# Patient Record
Sex: Female | Born: 1955 | Hispanic: Refuse to answer | Marital: Married
Health system: Midwestern US, Community
[De-identification: ages and names within clinical notes are randomized; demographics above are authoritative.]

## PROBLEM LIST (undated history)

## (undated) DIAGNOSIS — M461 Sacroiliitis, not elsewhere classified: Secondary | ICD-10-CM

## (undated) DIAGNOSIS — M457 Ankylosing spondylitis of lumbosacral region: Secondary | ICD-10-CM

## (undated) DIAGNOSIS — M5013 Cervical disc disorder with radiculopathy, cervicothoracic region: Secondary | ICD-10-CM

## (undated) DIAGNOSIS — M542 Cervicalgia: Secondary | ICD-10-CM

## (undated) DIAGNOSIS — R1084 Generalized abdominal pain: Secondary | ICD-10-CM

## (undated) DIAGNOSIS — K5790 Diverticulosis of intestine, part unspecified, without perforation or abscess without bleeding: Secondary | ICD-10-CM

## (undated) DIAGNOSIS — F329 Major depressive disorder, single episode, unspecified: Secondary | ICD-10-CM

## (undated) DIAGNOSIS — K567 Ileus, unspecified: Secondary | ICD-10-CM

## (undated) DIAGNOSIS — N760 Acute vaginitis: Secondary | ICD-10-CM

## (undated) DIAGNOSIS — M5090 Cervical disc disorder, unspecified, unspecified cervical region: Secondary | ICD-10-CM

## (undated) DIAGNOSIS — G4700 Insomnia, unspecified: Secondary | ICD-10-CM

## (undated) DIAGNOSIS — M797 Fibromyalgia: Secondary | ICD-10-CM

## (undated) DIAGNOSIS — F419 Anxiety disorder, unspecified: Secondary | ICD-10-CM

## (undated) DIAGNOSIS — I771 Stricture of artery: Secondary | ICD-10-CM

## (undated) DIAGNOSIS — F32A Depression, unspecified: Secondary | ICD-10-CM

## (undated) DIAGNOSIS — K915 Postcholecystectomy syndrome: Secondary | ICD-10-CM

## (undated) DIAGNOSIS — K589 Irritable bowel syndrome without diarrhea: Secondary | ICD-10-CM

## (undated) HISTORY — PX: MANDIBLE SURGERY: SHX707

## (undated) HISTORY — DX: Anxiety disorder, unspecified: F41.9

## (undated) HISTORY — DX: Major depressive disorder, single episode, unspecified: F32.9

## (undated) HISTORY — DX: Diverticulosis of intestine, part unspecified, without perforation or abscess without bleeding: K57.90

## (undated) HISTORY — DX: Stricture of artery: I77.1

## (undated) HISTORY — DX: Fibromyalgia: M79.7

## (undated) HISTORY — DX: Insomnia, unspecified: G47.00

## (undated) HISTORY — DX: Depression, unspecified: F32.A

## (undated) HISTORY — DX: Acute vaginitis: N76.0

## (undated) HISTORY — DX: Cervical disc disorder, unspecified, unspecified cervical region: M50.90

## (undated) HISTORY — PX: CHOLECYSTECTOMY: SHX55

## (undated) HISTORY — DX: Ileus, unspecified: K56.7

## (undated) HISTORY — DX: Irritable bowel syndrome, unspecified: K58.9

## (undated) HISTORY — DX: Postcholecystectomy syndrome: K91.5

---

## 1992-05-17 ENCOUNTER — Encounter: Payer: Self-pay | Admitting: Internal Medicine

## 1995-01-29 ENCOUNTER — Encounter: Payer: Self-pay | Admitting: Gastroenterology

## 1998-08-22 ENCOUNTER — Other Ambulatory Visit: Admission: RE | Admit: 1998-08-22 | Discharge: 1998-08-22 | Payer: Self-pay | Admitting: *Deleted

## 1998-08-25 ENCOUNTER — Encounter: Payer: Self-pay | Admitting: *Deleted

## 1998-08-25 ENCOUNTER — Ambulatory Visit (HOSPITAL_COMMUNITY): Admission: RE | Admit: 1998-08-25 | Discharge: 1998-08-25 | Payer: Self-pay | Admitting: *Deleted

## 1998-11-28 ENCOUNTER — Encounter: Payer: Self-pay | Admitting: *Deleted

## 1998-11-28 ENCOUNTER — Ambulatory Visit (HOSPITAL_COMMUNITY): Admission: RE | Admit: 1998-11-28 | Discharge: 1998-11-28 | Payer: Self-pay | Admitting: *Deleted

## 1999-08-23 ENCOUNTER — Encounter: Payer: Self-pay | Admitting: Orthopedic Surgery

## 1999-08-23 ENCOUNTER — Encounter: Admission: RE | Admit: 1999-08-23 | Discharge: 1999-08-23 | Payer: Self-pay | Admitting: Orthopedic Surgery

## 1999-11-24 ENCOUNTER — Other Ambulatory Visit: Admission: RE | Admit: 1999-11-24 | Discharge: 1999-11-24 | Payer: Self-pay | Admitting: *Deleted

## 2002-12-18 ENCOUNTER — Encounter: Payer: Self-pay | Admitting: Orthopedic Surgery

## 2002-12-18 ENCOUNTER — Encounter: Admission: RE | Admit: 2002-12-18 | Discharge: 2002-12-18 | Payer: Self-pay | Admitting: Orthopedic Surgery

## 2009-04-22 ENCOUNTER — Encounter (INDEPENDENT_AMBULATORY_CARE_PROVIDER_SITE_OTHER): Payer: Self-pay | Admitting: *Deleted

## 2009-04-22 ENCOUNTER — Encounter: Admission: RE | Admit: 2009-04-22 | Discharge: 2009-04-22 | Payer: Self-pay | Admitting: Family Medicine

## 2009-04-29 ENCOUNTER — Encounter: Payer: Self-pay | Admitting: Internal Medicine

## 2009-05-02 ENCOUNTER — Ambulatory Visit: Payer: Self-pay | Admitting: Internal Medicine

## 2009-05-04 ENCOUNTER — Telehealth: Payer: Self-pay | Admitting: Internal Medicine

## 2009-05-05 ENCOUNTER — Ambulatory Visit: Payer: Self-pay | Admitting: Internal Medicine

## 2009-05-06 ENCOUNTER — Telehealth: Payer: Self-pay | Admitting: Internal Medicine

## 2009-05-09 ENCOUNTER — Telehealth: Payer: Self-pay | Admitting: Internal Medicine

## 2009-05-12 ENCOUNTER — Ambulatory Visit: Payer: Self-pay | Admitting: Internal Medicine

## 2009-05-12 ENCOUNTER — Telehealth: Payer: Self-pay | Admitting: Internal Medicine

## 2009-05-12 DIAGNOSIS — IMO0001 Reserved for inherently not codable concepts without codable children: Secondary | ICD-10-CM | POA: Insufficient documentation

## 2009-05-12 DIAGNOSIS — R1011 Right upper quadrant pain: Secondary | ICD-10-CM | POA: Insufficient documentation

## 2009-05-12 DIAGNOSIS — F411 Generalized anxiety disorder: Secondary | ICD-10-CM | POA: Insufficient documentation

## 2009-05-12 DIAGNOSIS — Z8719 Personal history of other diseases of the digestive system: Secondary | ICD-10-CM | POA: Insufficient documentation

## 2009-05-12 DIAGNOSIS — K5730 Diverticulosis of large intestine without perforation or abscess without bleeding: Secondary | ICD-10-CM | POA: Insufficient documentation

## 2009-05-12 DIAGNOSIS — I10 Essential (primary) hypertension: Secondary | ICD-10-CM | POA: Insufficient documentation

## 2009-05-12 DIAGNOSIS — F329 Major depressive disorder, single episode, unspecified: Secondary | ICD-10-CM

## 2009-05-12 DIAGNOSIS — K648 Other hemorrhoids: Secondary | ICD-10-CM | POA: Insufficient documentation

## 2009-05-12 DIAGNOSIS — G4700 Insomnia, unspecified: Secondary | ICD-10-CM | POA: Insufficient documentation

## 2009-05-12 DIAGNOSIS — K589 Irritable bowel syndrome without diarrhea: Secondary | ICD-10-CM | POA: Insufficient documentation

## 2009-05-12 DIAGNOSIS — F331 Major depressive disorder, recurrent, moderate: Secondary | ICD-10-CM | POA: Insufficient documentation

## 2009-05-20 ENCOUNTER — Ambulatory Visit: Payer: Self-pay | Admitting: Internal Medicine

## 2009-05-20 ENCOUNTER — Encounter: Payer: Self-pay | Admitting: Internal Medicine

## 2009-05-20 LAB — CONVERTED CEMR LAB: UREASE: NEGATIVE

## 2009-05-24 ENCOUNTER — Encounter: Payer: Self-pay | Admitting: Internal Medicine

## 2009-05-31 ENCOUNTER — Telehealth: Payer: Self-pay | Admitting: Internal Medicine

## 2009-08-10 ENCOUNTER — Encounter: Payer: Self-pay | Admitting: Internal Medicine

## 2011-10-11 DIAGNOSIS — F411 Generalized anxiety disorder: Secondary | ICD-10-CM | POA: Insufficient documentation

## 2011-10-11 DIAGNOSIS — K219 Gastro-esophageal reflux disease without esophagitis: Secondary | ICD-10-CM | POA: Insufficient documentation

## 2014-07-22 ENCOUNTER — Other Ambulatory Visit: Payer: Self-pay | Admitting: Orthopedic Surgery

## 2014-07-22 DIAGNOSIS — M549 Dorsalgia, unspecified: Secondary | ICD-10-CM

## 2014-08-01 ENCOUNTER — Other Ambulatory Visit: Payer: Self-pay

## 2014-08-02 ENCOUNTER — Ambulatory Visit
Admission: RE | Admit: 2014-08-02 | Discharge: 2014-08-02 | Disposition: A | Discharge status: Home / Self Care | Payer: BC Managed Care – PPO | Source: Ambulatory Visit | Attending: Orthopedic Surgery | Admitting: Orthopedic Surgery

## 2014-08-02 DIAGNOSIS — M549 Dorsalgia, unspecified: Secondary | ICD-10-CM

## 2015-02-15 ENCOUNTER — Encounter: Payer: Self-pay | Admitting: Physician Assistant

## 2015-02-16 ENCOUNTER — Telehealth: Payer: Self-pay | Admitting: Internal Medicine

## 2015-02-16 NOTE — Telephone Encounter (Signed)
[facility] forward 45 pages to Dr.Brodie

## 2015-02-18 ENCOUNTER — Other Ambulatory Visit: Payer: Self-pay | Admitting: *Deleted

## 2015-02-21 ENCOUNTER — Encounter: Payer: Self-pay | Admitting: Internal Medicine

## 2015-02-21 ENCOUNTER — Ambulatory Visit (INDEPENDENT_AMBULATORY_CARE_PROVIDER_SITE_OTHER): Payer: BLUE CROSS/BLUE SHIELD | Admitting: Physician Assistant

## 2015-02-21 ENCOUNTER — Encounter: Payer: Self-pay | Admitting: Physician Assistant

## 2015-02-21 VITALS — BP 122/76 | HR 73 | Ht 63.25 in | Wt 168.0 lb

## 2015-02-21 DIAGNOSIS — K589 Irritable bowel syndrome without diarrhea: Secondary | ICD-10-CM

## 2015-02-21 DIAGNOSIS — R1013 Epigastric pain: Secondary | ICD-10-CM | POA: Diagnosis not present

## 2015-02-21 DIAGNOSIS — R109 Unspecified abdominal pain: Secondary | ICD-10-CM | POA: Diagnosis not present

## 2015-02-21 MED ORDER — DIAZEPAM 2 MG PO TABS: 2.0000 mg | ORAL_TABLET | ORAL | Status: DC | PRN

## 2015-02-21 NOTE — Patient Instructions (Signed)
You have been scheduled for an endoscopy and colonoscopy. Please follow the written instructions given to you at your visit today. Please pick up your prep supplies at the pharmacy within the next 1-3 days. If you use inhalers (even only as needed), please bring them with you on the day of your procedure. Your physician has requested that you go to www.startemmi.com and enter the access code given to you at your visit today. This web site gives a general overview about your procedure. However, you should still follow specific instructions given to you by our office regarding your preparation for the procedure. Use your ranitidine 150 mg twice daily.   Gastroesophageal Reflux Disease, Adult Gastroesophageal reflux disease (GERD) happens when acid from your stomach flows up into the esophagus. When acid comes in contact with the esophagus, the acid causes soreness (inflammation) in the esophagus. Over time, GERD may create small holes (ulcers) in the lining of the esophagus. CAUSES   Increased body weight. This puts pressure on the stomach, making acid rise from the stomach into the esophagus.  Smoking. This increases acid production in the stomach.  Drinking alcohol. This causes decreased pressure in the lower esophageal sphincter (valve or ring of muscle between the esophagus and stomach), allowing acid from the stomach into the esophagus.  Late evening meals and a full stomach. This increases pressure and acid production in the stomach.  A malformed lower esophageal sphincter. Sometimes, no cause is found. SYMPTOMS   Burning pain in the lower part of the mid-chest behind the breastbone and in the mid-stomach area. This may occur twice a week or more often.  Trouble swallowing.  Sore throat.  Dry cough.  Asthma-like symptoms including chest tightness, shortness of breath, or wheezing. DIAGNOSIS  Your caregiver may be able to diagnose GERD based on your symptoms. In some cases, X-rays  and other tests may be done to check for complications or to check the condition of your stomach and esophagus. TREATMENT  Your caregiver may recommend over-the-counter or prescription medicines to help decrease acid production. Ask your caregiver before starting or adding any new medicines.  HOME CARE INSTRUCTIONS   Change the factors that you can control. Ask your caregiver for guidance concerning weight loss, quitting smoking, and alcohol consumption.  Avoid foods and drinks that make your symptoms worse, such as:  Caffeine or alcoholic drinks.  Chocolate.  Peppermint or mint flavorings.  Garlic and onions.  Spicy foods.  Citrus fruits, such as oranges, lemons, or limes.  Tomato-based foods such as sauce, chili, salsa, and pizza.  Fried and fatty foods.  Avoid lying down for the 3 hours prior to your bedtime or prior to taking a nap.  Eat small, frequent meals instead of large meals.  Wear loose-fitting clothing. Do not wear anything tight around your waist that causes pressure on your stomach.  Raise the head of your bed 6 to 8 inches with wood blocks to help you sleep. Extra pillows will not help.  Only take over-the-counter or prescription medicines for pain, discomfort, or fever as directed by your caregiver.  Do not take aspirin, ibuprofen, or other nonsteroidal anti-inflammatory drugs (NSAIDs). SEEK IMMEDIATE MEDICAL CARE IF:   You have pain in your arms, neck, jaw, teeth, or back.  Your pain increases or changes in intensity or duration.  You develop nausea, vomiting, or sweating (diaphoresis).  You develop shortness of breath, or you faint.  Your vomit is green, yellow, black, or looks like coffee grounds or blood.  Your stool is red, bloody, or black. These symptoms could be signs of other problems, such as heart disease, gastric bleeding, or esophageal bleeding. MAKE SURE YOU:   Understand these instructions.  Will watch your condition.  Will get  help right away if you are not doing well or get worse. Document Released: 06/13/2005 Document Revised: 11/26/2011 Document Reviewed: 03/23/2011 [facility] Patient Information 2015 [place] [place]. This information is not intended to replace advice given to you by your health care provider. Make sure you discuss any questions you have with your health care provider.

## 2015-02-21 NOTE — Progress Notes (Signed)
Reviewed. What was she drinking for 4 days? That caused her to get sick? I will proceed with EGD/colon.

## 2015-02-21 NOTE — Progress Notes (Addendum)
**Note Valerie-Identified via Obfuscation** Patient ID: Valerie Conway, female   DOB: 09/24/1955, 59 y.o.   MRN: [id number]    HPI:  Valerie HollingsheadDeborah H Stigler is a 59 y.o.   female who is known to Dr. Juanda ChanceBrodie with a history of diverticulosis, IBS, and GERD. She was last seen here in 2010. She had an EGD on 05/20/2009 due to right upper quadrant abdominal and epigastric pain. The endoscopy was a normal EGD. She had a colonoscopy on 05/05/2009 due to hematochezia. She was noted to have moderate diverticulosis in the sigmoid colon.  She managed her symptoms with Nexium and Bentyl and several months ago began to come off those medications. Approximately 3-3-1/2 weeks ago she was vacationing in [place] when she developed severe abdominal pain. She had been drinking mojitos daily for about 4 days when she developed epigastric and right-sided abdominal pain. She went to the emergency room where she was visiting in [place] and had a CT scan that showed an ileus versus small bowel obstruction. She vomited a large amount of fluid and she did not require an NG tube. Her symptoms resolved in 2 days with IV fluids and bowel rest. She says she was not given specific instructions on what to eat upon discharge and so she quickly advanced her diet and had recurrent symptoms of vomiting and abdominal distention. She put herself nothing by mouth for 24 hours then stayed on clear liquids for a couple of days and felt better. She continues to have some epigastric discomfort that is not alleviated or exacerbated with ingestion of food. She complains of feeling full after 2 bites of food and occasionally feels as if her food sticks in the lower esophagus. She has not been using her Nexium for quite some time as she says her primary care provider instructed her to use ranitidine in its place. She denies complaints of heartburn. Her bowel movements have been irregular with days of formed stools alternating with days of loose stools, however she has not had any bright red blood per rectum  or melena. She does feel her lower abdomen has been more sore over the past several months than it has been in the past. She has spent the past 3 weeks investigating etiologies for ileus and small bowel obstructions and has many questions.   Past Medical History  Diagnosis Date  . Fibromyalgia   . Insomnia   . Cervical disc disease   . Anxiety and depression   . Recurrent vaginitis   . IBS (irritable bowel syndrome)   . Post-cholecystectomy syndrome   . Ileus     Past Surgical History  Procedure Laterality Date  . Cholecystectomy     Family History  Problem Relation Age of Onset  . Alzheimer's disease Mother   . Pancreatitis Mother   . Gallbladder disease Mother   . Hypothyroidism Mother   . Colon cancer Maternal Uncle   . Prostate cancer Father   . Throat cancer Father   . Lung cancer Father   . Colon cancer Paternal Uncle    History  Substance Use Topics  . Smoking status: Never Smoker   . Smokeless tobacco: Never Used  . Alcohol Use: 0.0 oz/week    0 Standard drinks or equivalent per week     Comment: Social   Current Outpatient Prescriptions  Medication Sig Dispense Refill  . acetaminophen (TYLENOL) 325 MG tablet Take 650 mg by mouth every 6 (six) hours as needed.    . dicyclomine (BENTYL) 10 MG capsule Take 10  mg by mouth every other day.    . docusate sodium (COLACE) 100 MG capsule Take 100 mg by mouth every other day.    Marland Kitchen LORazepam (ATIVAN) 1 MG tablet   1  . Probiotic Product (ALIGN) 4 MG CAPS Take 4 mg by mouth daily.    Marland Kitchen bismuth subsalicylate (PEPTO BISMOL) 262 MG chewable tablet Chew 524 mg by mouth 2 (two) times daily as needed.    . cholecalciferol (VITAMIN D) 1000 UNITS tablet Take 3,000 Units by mouth 2 (two) times daily.     . Multiple Vitamins-Minerals (AIRBORNE) CHEW Chew 1 tablet by mouth.    . ranitidine (ZANTAC) 300 MG capsule   1  . zolpidem (AMBIEN) 10 MG tablet Take 10 mg by mouth.     No current facility-administered medications for this  visit.   Allergies  Allergen Reactions  . Codeine     Other reaction(s): Other Esophageal spasm  . Metronidazole Nausea And Vomiting    Able to tolerate brand Flagyl  . Wheat Dextrin     REACTION: tongue swelling  . Oxycodone-Acetaminophen Diarrhea    Able to tolerate brand Tylox     Review of Systems: Gen: Denies any fever, chills, sweats, anorexia, fatigue, weakness, malaise, weight loss, and sleep disorder CV: Denies chest pain, angina, palpitations, syncope, orthopnea, PND, peripheral edema, and claudication. Resp: Denies dyspnea at rest, dyspnea with exercise, cough, sputum, wheezing, coughing up blood, and pleurisy. GI: Denies vomiting blood, jaundice, and fecal incontinence.  Has occasional dysphagia to solids. GU : Denies urinary burning, blood in urine, urinary frequency, urinary hesitancy, nocturnal urination, and urinary incontinence. MS: Denies joint pain, limitation of movement, and swelling, stiffness, low back pain, extremity pain. Denies muscle weakness, cramps, atrophy.  Derm: Denies rash, itching, dry skin, hives, moles, warts, or unhealing ulcers.  Psych: Denies depression, anxiety, memory loss, suicidal ideation, hallucinations, paranoia, and confusion. Heme: Denies bruising, bleeding, and enlarged lymph nodes. Neuro:  Denies any headaches, dizziness, paresthesias. Endo:  Denies any problems with DM, thyroid, adrenal function  Studies: CT of the abdomen and pelvis done at [facility] [place] on 01/24/2015 showed several loops of dilated small bowel in the mid abdomen with decompressed small bowel proximal and distal to these loops these are not associated with any significant inflammatory change, extraluminal fluid or significant bowel wall thickening. Suggests partial small bowel obstruction or focal ileus. Sigmoid diverticulosis without diverticulitis. Diffuse hepatic steatosis.  LAB RESULTS:   Physical Exam: BP 122/76 mmHg  Pulse 73   Ht 5' 3.25" (1.607 m)  Wt 168 lb (76.204 kg)  BMI 29.51 kg/m2 Constitutional: Pleasant,well-developedfemale in no acute distress. HEENT: Normocephalic and atraumatic. Conjunctivae are normal. No scleral icterus. Neck supple. No thyromegaly Cardiovascular: Normal rate, regular rhythm.  Pulmonary/chest: Effort normal and breath sounds normal. No wheezing, rales or rhonchi. Abdominal: Soft, nondistended, nontender. Bowel sounds active throughout. There are no masses palpable. No hepatomegaly. Extremities: no edema Lymphadenopathy: No cervical adenopathy noted. Neurological: Alert and oriented to person place and time. Skin: Skin is warm and dry. No rashes noted. Psychiatric: Normal mood and affect. Behavior is normal.  ASSESSMENT AND PLAN: #1. Dyspepsia and early satiety. These may be due to esophagitis, gastritis, ulcer etc. An antireflux regimen has been reviewed at length with the patient she readily admits that she has an Affinity for citrus juices like lemonade, lyme-ade, orange juice and is quite discouraged that she is not able to tolerate any of these without discomfort. She also  drinks a lot of coffee and has recently tried to decrease this as it bothers her stomach as well. She also has decreased alcohol intake though she did not ever drink on a regular basis. She will use her ranitidine 150 mg twice a day. She has been having some intermittent dysphagia to solids, and it was explained that this may be due to some poorly controlled reflux, but she prefers to not restart on a PPI unless necessary. She will be scheduled for an EGD to evaluate for esophagitis, gastritis, ulcer, retained food etc.The risks, benefits, and alternatives to endoscopy with possible biopsy and possible dilation were discussed with the patient and they consent to proceed.   #2. Lower abdominal pain and erratic bowel movements. Certainly likely due to her IBS but she says her discomfort has been more pronounced over the  past few months. She is to try to what here to a low fat diet and to increase her fiber intake. She will be scheduled for repeat colonoscopy to evaluate for polyps, neoplasia, inflammatory process etc.The risks, benefits, and alternatives to colonoscopy with possible biopsy and possible polypectomy were discussed with the patient and they consent to proceed. She will use her Bentyl on an as-needed basis.  #3. Recent ileus versus small bowel obstruction. It was explained to the patient's that it is unclear what the exact etiology of her recent ileus was however hopefully her colonoscopy and EGD may shed some light on this.  Further recommendations will be made pending the findings of the above.    Rizwan Kuyper, Moise Boring 02/21/2015, 2:37 PM  CC: Marinda Elk, MD

## 2015-03-02 ENCOUNTER — Encounter: Payer: BLUE CROSS/BLUE SHIELD | Admitting: Internal Medicine

## 2015-03-02 ENCOUNTER — Encounter: Payer: Self-pay | Admitting: Internal Medicine

## 2015-03-18 ENCOUNTER — Telehealth: Payer: Self-pay | Admitting: Internal Medicine

## 2015-03-18 NOTE — Telephone Encounter (Signed)
Patient is asking if she can take the prep earlier than 9:30 AM since she has a 1 hour drive to get her and the prep usually causes her diarrhea for 5-6 hours. She will take it an hour earlier and drink only clear liquids until 11:30 AM.

## 2015-03-23 ENCOUNTER — Telehealth: Payer: Self-pay | Admitting: Internal Medicine

## 2015-03-23 ENCOUNTER — Other Ambulatory Visit: Payer: Self-pay

## 2015-03-23 ENCOUNTER — Other Ambulatory Visit (INDEPENDENT_AMBULATORY_CARE_PROVIDER_SITE_OTHER): Payer: BLUE CROSS/BLUE SHIELD

## 2015-03-23 ENCOUNTER — Ambulatory Visit (AMBULATORY_SURGERY_CENTER): Payer: BLUE CROSS/BLUE SHIELD | Admitting: Internal Medicine

## 2015-03-23 ENCOUNTER — Encounter: Payer: Self-pay | Admitting: Internal Medicine

## 2015-03-23 VITALS — BP 144/77 | HR 78 | Temp 96.4°F | Resp 17 | Ht 63.25 in | Wt 168.0 lb

## 2015-03-23 DIAGNOSIS — R109 Unspecified abdominal pain: Secondary | ICD-10-CM | POA: Diagnosis not present

## 2015-03-23 DIAGNOSIS — K209 Esophagitis, unspecified without bleeding: Secondary | ICD-10-CM

## 2015-03-23 DIAGNOSIS — K2970 Gastritis, unspecified, without bleeding: Secondary | ICD-10-CM | POA: Diagnosis not present

## 2015-03-23 DIAGNOSIS — R1013 Epigastric pain: Secondary | ICD-10-CM | POA: Diagnosis not present

## 2015-03-23 DIAGNOSIS — K5730 Diverticulosis of large intestine without perforation or abscess without bleeding: Secondary | ICD-10-CM | POA: Diagnosis not present

## 2015-03-23 DIAGNOSIS — K633 Ulcer of intestine: Secondary | ICD-10-CM

## 2015-03-23 DIAGNOSIS — K208 Other esophagitis: Secondary | ICD-10-CM | POA: Diagnosis not present

## 2015-03-23 DIAGNOSIS — K2950 Unspecified chronic gastritis without bleeding: Secondary | ICD-10-CM | POA: Diagnosis not present

## 2015-03-23 LAB — COMPREHENSIVE METABOLIC PANEL
ALBUMIN: 4.2 g/dL (ref 3.5–5.2)
ALT: 18 U/L (ref 0–35)
AST: 11 U/L (ref 0–37)
Alkaline Phosphatase: 124 U/L — ABNORMAL HIGH (ref 39–117)
BUN: 8 mg/dL (ref 6–23)
CHLORIDE: 101 meq/L (ref 96–112)
CO2: 27 meq/L (ref 19–32)
CREATININE: 0.67 mg/dL (ref 0.40–1.20)
Calcium: 9.5 mg/dL (ref 8.4–10.5)
GFR: 95.56 mL/min (ref >60.00)
GLUCOSE: 121 mg/dL — AB (ref 70–99)
POTASSIUM: 3.8 meq/L (ref 3.5–5.1)
Sodium: 138 mEq/L (ref 135–145)
Total Bilirubin: 0.6 mg/dL (ref 0.2–1.2)
Total Protein: 7.7 g/dL (ref 6.0–8.3)

## 2015-03-23 LAB — SEDIMENTATION RATE: Sed Rate: 31 mm/h — ABNORMAL HIGH (ref 0–22)

## 2015-03-23 MED ORDER — CIPROFLOXACIN HCL 500 MG PO TABS: 250.0000 mg | ORAL_TABLET | Freq: Two times a day (BID) | ORAL | Status: DC

## 2015-03-23 MED ORDER — SODIUM CHLORIDE 0.9 % IV SOLN: 500.0000 mL | INTRAVENOUS | Status: DC

## 2015-03-23 NOTE — Progress Notes (Signed)
Called to room to assist during endoscopic procedure.  Patient ID and intended procedure confirmed with present staff. Received instructions for my participation in the procedure from the performing physician.  

## 2015-03-23 NOTE — Progress Notes (Signed)
Transferred to recovery room. A/O x3, pleased with MAC.  VSS.  Report to Karen, RN. 

## 2015-03-23 NOTE — Telephone Encounter (Signed)
Phone call returned to patient. Patient with complaints of vomiting last evening after taking dulcolax though she was able to complete miralax last night and this morning. She reports that her stools are clear yellow at this point. She also has complaints of right upper quadrant pain that at times feels very "acute". Wants to know if she can take Tylenol. Advised her to take Tylenol at 1136 (time of first phone call) and then remain NPO until after procedure. She states she was diagnosed with a small bowl ileus awhile back and her biggest concern right now is that she is "obstructing again". Discussed with Dr. Juanda ChanceBrodie, who reports that she also spoke with patients husband during the middle of the night with the same complaints to which patient states is true. Inquired if she was still vomiting or if just one occurrence last evening which had already been discussed with Dr. Juanda ChanceBrodie, she states just the one occurrence. Dr. Juanda ChanceBrodie agrees with Tylenol and will see patient at [facility]. Patient reassured and agrees.

## 2015-03-23 NOTE — Progress Notes (Signed)
Patient came in today c/o nausea, has not vomit today, did vomit yesterday. Patient states she vomit in ER after receiving Zofran. Patient denies any allergies to eggs or soy.

## 2015-03-23 NOTE — Patient Instructions (Signed)
YOU WILL GO TO THE LAB. THIS AFTERNOON FOR CBC, SED RATE AND C-MET.   CT SCAN HAS BEEN ORDERED AND SCHEDULED FOR Monday, March 28, 2015 AT 2 PM.  RETURN APPOINTMENT WITH [facility] IN [facility] , Wednesday, July 20-2016 AT 8:45.  HANDOUTS GIVEN FOR GASTRITIS, DIVERTICULOSIS AND LOW RESIDUE DIET.  YOU HAD AN ENDOSCOPIC PROCEDURE TODAY AT [facility]:   Refer to the procedure report that was given to you for any specific questions about what was found during the examination.  If the procedure report does not answer your questions, please call your gastroenterologist to clarify.  If you requested that your care partner not be given the details of your procedure findings, then the procedure report has been included in a sealed envelope for you to review at your convenience later.  YOU SHOULD EXPECT: Some feelings of bloating in the abdomen. Passage of more gas than usual.  Walking can help get rid of the air that was put into your GI tract during the procedure and reduce the bloating. If you had a lower endoscopy (such as a colonoscopy or flexible sigmoidoscopy) you may notice spotting of blood in your stool or on the toilet paper. If you underwent a bowel prep for your procedure, you may not have a normal bowel movement for a few days.  Please Note:  You might notice some irritation and congestion in your nose or some drainage.  This is from the oxygen used during your procedure.  There is no need for concern and it should clear up in a day or so.  SYMPTOMS TO REPORT IMMEDIATELY:   Following lower endoscopy (colonoscopy or flexible sigmoidoscopy):  Excessive amounts of blood in the stool  Significant tenderness or worsening of abdominal pains  Swelling of the abdomen that is new, acute  Fever of 100F or higher   Following upper endoscopy (EGD)  Vomiting of blood or coffee ground material  New chest pain or pain under the shoulder blades  Painful or persistently difficult  swallowing  New shortness of breath  Fever of 100F or higher  Black, tarry-looking stools  For urgent or emergent issues, a gastroenterologist can be reached at any hour by calling [phone].   DIET: Your first meal following the procedure should be a small meal and then it is ok to progress to your normal diet. Heavy or fried foods are harder to digest and may make you feel nauseous or bloated.  Likewise, meals heavy in dairy and vegetables can increase bloating.  Drink plenty of fluids but you should avoid alcoholic beverages for 24 hours.  ACTIVITY:  You should plan to take it easy for the rest of today and you should NOT DRIVE or use heavy machinery until tomorrow (because of the sedation medicines used during the test).    FOLLOW UP: Our staff will call the number listed on your records the next business day following your procedure to check on you and address any questions or concerns that you may have regarding the information given to you following your procedure. If we do not reach you, we will leave a message.  However, if you are feeling well and you are not experiencing any problems, there is no need to return our call.  We will assume that you have returned to your regular daily activities without incident.  If any biopsies were taken you will be contacted by phone or by letter within the next 1-3 weeks.  Please call  us at [phone] if you have not heard about the biopsies in 3 weeks.    SIGNATURES/CONFIDENTIALITY: You and/or your care partner have signed paperwork which will be entered into your electronic medical record.  These signatures attest to the fact that that the information above on your After Visit Summary has been reviewed and is understood.  Full responsibility of the confidentiality of this discharge information lies with you and/or your care-partner.

## 2015-03-23 NOTE — Op Note (Signed)
**Note Valerie-Identified via Obfuscation** [facility] [address] [place], [id number]   ENDOSCOPY PROCEDURE REPORT  PATIENT: Valerie Conway, [name]  MR#: [id number] BIRTHDATE: Jun 07, 1956 , 58  yrs. old GENDER: female ENDOSCOPIST: Hart Carwinora M Konner Saiz, MD REFERRED BY:  Marinda Elkobert Fried, M.D. PROCEDURE DATE:  03/23/2015 PROCEDURE:  EGD w/ biopsy ASA CLASS:     Class II INDICATIONS:  epigastric pain and Nausea vomiting and dysphagia. History of gastroesophageal reflux.  Seen in the emergency room in the last few weeks and diagnosed with, possibility of small bowel obstruction versus ileus.  Last upper endoscopy in September 2010 was normal. MEDICATIONS: Monitored anesthesia care and Propofol 200 mg IV TOPICAL ANESTHETIC: none  DESCRIPTION OF PROCEDURE: After the risks benefits and alternatives of the procedure were thoroughly explained, informed consent was obtained.  The LB [id number] endoscope was introduced through the mouth and advanced to the second portion of the duodenum , Without limitations.  The instrument was slowly withdrawn as the mucosa was fully examined.      ESOPHAGUS: There was LA Class A esophagitis (One or more mucosal breaks < 5 mm in maximal length) noted.  ,biopsies were obtained to rule out Barrett's esophagus  STOMACH: Gastritis (inflammation) was found in the gastric antrum. ,mild antral gastritis. Biopsies obtained to rule out H. pylori retroflexion of the endoscope revealed normal fundus and cardia  DUODENUM: The duodenal mucosa showed no abnormalities in the bulb and 2nd part of the duodenum.  Retroflexed views revealed no abnormalities.     The scope was then withdrawn from the patient and the procedure completed.  COMPLICATIONS: There were no immediate complications.  ENDOSCOPIC IMPRESSION: 1.   There was LA Class A esophagitis noted ,probably due to vomiting and reflux 2.   Gastritis (inflammation) was found in the gastric antrum ,See for H. pylori pending 3.   The  duodenal mucosa showed no abnormalities in the bulb and 2nd part of the duodenum  RECOMMENDATIONS: 1.  Await pathology results 2.  Continue PPI 3.  Continue PPI 4.  Continue current medications 5.  Zantac 150 mg twice a day  REPEAT EXAM: for EGD pending biopsy results.  eSigned:  Hart Carwinora M Mithra Spano, MD 03/23/2015 3:26 PM    CC:  PATIENT NAME:  Valerie Conway, Acadia H MR#: [id number]

## 2015-03-23 NOTE — Op Note (Signed)
[place]  [address]. [place], [address]   COLONOSCOPY PROCEDURE REPORT  PATIENT: Valerie, Conway  MR#: [id number] BIRTHDATE: [birth year] , 57  yrs. old GENDER: female ENDOSCOPIST: Lafayette Dragon, MD REFERRED KG:[id number] Maceo Pro, M.D. PROCEDURE DATE:  03/23/2015 PROCEDURE:   Colonoscopy, diagnostic and Colonoscopy with biopsy First Screening Colonoscopy - Avg.  risk and is 50 yrs.  old or older - No.  Prior Negative Screening - Now for repeat screening. 10 or more years since last screening Prior Negative Screening - Now for repeat screening.  Less than 10 yrs Prior Negative Screening - Now for repeat screening.  Other: See Comments  History of Adenoma - Now for follow-up colonoscopy & has been > or = to 3 yrs.  N/A  Polyps removed today? No Recommend repeat exam, <10 yrs? No ASA CLASS:   Class II INDICATIONS:Evaluation of barium enema or other imaging study of an abnormality that is likely to be clinically significant, such as filling defect or stricture, acute abdominal pain evaluated in the emergency room.  Abnormal CT scan of the abdomen ileus versus obstruction.  Last colonoscopy August 2010 showed moderately severe diverticulosis.  Positive family history of colon cancer in maternal uncle and paternal uncle.  History of irritable bowel syndrome, and Colorectal Neoplasm Risk Assessment for this procedure is average risk. MEDICATIONS: Monitored anesthesia care and Propofol 150 mg IV  DESCRIPTION OF PROCEDURE:   After the risks benefits and alternatives of the procedure were thoroughly explained, informed consent was obtained.  The digital rectal exam revealed no abnormalities of the rectum.   The LB [id number]  endoscope was introduced through the anus and advanced to the cecum, which was identified by both the appendix and ileocecal valve. No adverse events experienced.   The quality of the prep was good.  (MoviPrep was used)  The instrument was then  slowly withdrawn as the colon was fully examined. Estimated blood loss is zero unless otherwise noted in this procedure report.      COLON FINDINGS: A single non-bleeding, shallow and round ulcer ranging between 3-5m in size with surrounding edema was found in the sigmoid colon.  Biopsies were taken at the center of the ulcer. There was severe diverticulosis noted in the left colon with associated angulation, colonic narrowing and tortuosity. Retroflexed views revealed no abnormalities. The time to cecum = 3.56 Withdrawal time = 10.04   The scope was withdrawn and the procedure completed. COMPLICATIONS: There were no immediate complications.  ENDOSCOPIC IMPRESSION: 1.   Single ulcer ranging between 3-779min size was found in the sigmoid colon; biopsies were taken , suspect resolving diverticulitis 2.   There was severe diverticulosis noted in the left colon with partial colon obstruction  RECOMMENDATIONS: 1.  Await pathology results 2.  Liquids and low-residue diet Repeat CT scan of the abdomen and pelvis to follow up on CT scan from [place] weeks ago which showed SB ileus versus small bowel obstruction CBC,sed rate, c-met today Cipro 250 mg po bid, #14 OV 1 week  eSigned:  DoLafayette DragonMD 03/23/2015 3:19 PM   cc:   PATIENT NAME:  Valerie, LitchfordR#: [id number]

## 2015-03-23 NOTE — Progress Notes (Signed)
CALLED "BETH" WITH [facility] TO HAVE CT SCAN, BLOOD WORK AND RETURN OFFICE VISIT IN ONE WEEK. PRINTED PRESCRIPTION FOR CIPRO GIVEN TO PATIENT'S CAREGIVER.

## 2015-03-24 ENCOUNTER — Telehealth: Payer: Self-pay | Admitting: *Deleted

## 2015-03-24 LAB — CBC WITH DIFFERENTIAL/PLATELET
BASOS ABS: 0.1 10*3/uL (ref 0.0–0.1)
Basophils Relative: 0.4 % (ref 0.0–3.0)
Eosinophils Absolute: 0.1 10*3/uL (ref 0.0–0.7)
Eosinophils Relative: 0.7 % (ref 0.0–5.0)
HEMATOCRIT: 43.7 % (ref 36.0–46.0)
Hemoglobin: 14.7 g/dL (ref 12.0–15.0)
LYMPHS ABS: 2.3 10*3/uL (ref 0.7–4.0)
Lymphocytes Relative: 14.4 % (ref 12.0–46.0)
MCHC: 33.5 g/dL (ref 30.0–36.0)
MCV: 87.9 fl (ref 78.0–100.0)
MONOS PCT: 5.3 % (ref 3.0–12.0)
Monocytes Absolute: 0.9 10*3/uL (ref 0.1–1.0)
Neutro Abs: 12.9 10*3/uL — ABNORMAL HIGH (ref 1.4–7.7)
Neutrophils Relative %: 79.2 % — ABNORMAL HIGH (ref 43.0–77.0)
PLATELETS: 453 10*3/uL — AB (ref 150.0–400.0)
RBC: 4.97 Mil/uL (ref 3.87–5.11)
RDW: 13.1 % (ref 11.5–15.5)
WBC: 16.3 10*3/uL — ABNORMAL HIGH (ref 4.0–10.5)

## 2015-03-24 NOTE — Telephone Encounter (Signed)
  Follow up Call-  Call back number 03/23/2015  Post procedure Call Back phone  # [phone]  Permission to leave phone message Yes     Patient questions:  Do you have a fever, pain , or abdominal swelling? Yes.   Pain Score  3 *  Have you tolerated food without any problems? Yes.    Have you been able to return to your normal activities? Yes.    Do you have any questions about your discharge instructions: Diet   No. Medications  Yes.   Follow up visit  No.  Do you have questions or concerns about your Care? No.  Actions: * If pain score is 4 or above: No action needed, pain <4.  C/o abdominal pain, rating as a "3" on the sides of her lower abdomen- states she has no nausea or fever and is able to eat.  Has had a small bowel movement.  She states that she hasn't passed much air.  I told her to ambulate and to drink warm fluids to see if air is trapped in her colon causing pain and to call back if pain increases.  Understanding voiced.  Question about Cipro answered

## 2015-03-25 ENCOUNTER — Other Ambulatory Visit: Payer: Self-pay | Admitting: *Deleted

## 2015-03-25 ENCOUNTER — Telehealth: Payer: Self-pay | Admitting: Internal Medicine

## 2015-03-25 MED ORDER — CIPROFLOXACIN HCL 500 MG PO TABS: 500.0000 mg | ORAL_TABLET | Freq: Two times a day (BID) | ORAL | Status: DC

## 2015-03-25 NOTE — Telephone Encounter (Signed)
Please continue Cipro till her OV with [facility] in next 10 days Lawson Fiscal( Lori), she will decide if to continue. Cipro and  how much longer. CT scan follow up pending, will let her know results.

## 2015-03-25 NOTE — Telephone Encounter (Signed)
Patient states her Cipro was suppose to be taken for 20 days. Please, advise if she is to take it for 10 days or 20 days.

## 2015-03-28 ENCOUNTER — Ambulatory Visit (INDEPENDENT_AMBULATORY_CARE_PROVIDER_SITE_OTHER)
Admission: RE | Admit: 2015-03-28 | Discharge: 2015-03-28 | Disposition: A | Discharge status: Home / Self Care | Payer: BLUE CROSS/BLUE SHIELD | Source: Ambulatory Visit | Attending: Internal Medicine | Admitting: Internal Medicine

## 2015-03-28 DIAGNOSIS — R109 Unspecified abdominal pain: Secondary | ICD-10-CM | POA: Diagnosis not present

## 2015-03-28 MED ORDER — IOHEXOL 300 MG/ML  SOLN
100.0000 mL | Freq: Once | INTRAMUSCULAR | Status: AC | PRN
  Administered 2015-03-28: 100 mL via INTRAVENOUS

## 2015-03-28 NOTE — Telephone Encounter (Signed)
Left a message for patient with recommendations.(Answering machine identified patient)

## 2015-03-30 ENCOUNTER — Encounter: Payer: Self-pay | Admitting: Internal Medicine

## 2015-03-31 ENCOUNTER — Telehealth: Payer: Self-pay | Admitting: Internal Medicine

## 2015-03-31 DIAGNOSIS — R109 Unspecified abdominal pain: Secondary | ICD-10-CM

## 2015-03-31 MED ORDER — METRONIDAZOLE 500 MG PO TABS: 500.0000 mg | ORAL_TABLET | Freq: Three times a day (TID) | ORAL | Status: DC

## 2015-03-31 NOTE — Telephone Encounter (Signed)
Please have patient adhere to a low fat, low residue diet until she is seen on the 20th. She has Flagyl listed as an allergy and that's why it has not been given to her. Can she tolerate Augmentin? If she can, we will stop the Cipro and give her a trial of Augmentin. But me now because we can give her Augmentin suspension as a lower dose and the pills.

## 2015-03-31 NOTE — Telephone Encounter (Signed)
Spoke with patient and she states she had a good day yesterday until she ate supper. She ate quiche and after she ate she had right upper quad pain. She also states she had left side "jabs" of pain. States this continues today. Reports she feels sick today. States she is light headed and dizzy. Reports she is in bed. Denies nausea, vomiting. She took Nexium, Probiotics and Tylenol. She has not taken Cipro because she states she felt so bad. She will take it now. She states her PCP usually gives her Flagyl with the Cipro and is wondering if she needs that too. She has eaten pudding today but will go back to clear liquids. Please, advise.

## 2015-03-31 NOTE — Telephone Encounter (Signed)
Spoke with patient and she is not sure if she has taken Augmentin. She wants to stay on Cipro. She states she has taken Flagyl many times and is not allergic to it. She will adhere to the low fat, low reside diet.

## 2015-03-31 NOTE — Telephone Encounter (Signed)
Patient notified of recommendations. Rx sent to pharmacy. Lab in [facility].

## 2015-03-31 NOTE — Telephone Encounter (Signed)
Add Flagyl 500 mg 3 times a day for 7 days. Continue Cipro 500 mg twice a day during that period. Low-fat, low residue diet. Please have her go for a CBC with differential. If she is not improving, if she has a fever, or if she feels worse over the next few days she should go to the emergency room.

## 2015-04-03 ENCOUNTER — Telehealth: Payer: Self-pay | Admitting: Gastroenterology

## 2015-04-05 ENCOUNTER — Other Ambulatory Visit (INDEPENDENT_AMBULATORY_CARE_PROVIDER_SITE_OTHER): Payer: BLUE CROSS/BLUE SHIELD

## 2015-04-05 DIAGNOSIS — R109 Unspecified abdominal pain: Secondary | ICD-10-CM

## 2015-04-05 LAB — CBC WITH DIFFERENTIAL/PLATELET
BASOS ABS: 0.1 10*3/uL (ref 0.0–0.1)
BASOS PCT: 0.7 % (ref 0.0–3.0)
Eosinophils Absolute: 0.2 10*3/uL (ref 0.0–0.7)
Eosinophils Relative: 1.5 % (ref 0.0–5.0)
HCT: 45.5 % (ref 36.0–46.0)
Hemoglobin: 15.3 g/dL — ABNORMAL HIGH (ref 12.0–15.0)
LYMPHS ABS: 2 10*3/uL (ref 0.7–4.0)
LYMPHS PCT: 15.8 % (ref 12.0–46.0)
MCHC: 33.5 g/dL (ref 30.0–36.0)
MCV: 87.8 fl (ref 78.0–100.0)
MONOS PCT: 6.3 % (ref 3.0–12.0)
Monocytes Absolute: 0.8 10*3/uL (ref 0.1–1.0)
NEUTROS PCT: 75.7 % (ref 43.0–77.0)
Neutro Abs: 9.8 10*3/uL — ABNORMAL HIGH (ref 1.4–7.7)
Platelets: 489 10*3/uL — ABNORMAL HIGH (ref 150.0–400.0)
RBC: 5.19 Mil/uL — ABNORMAL HIGH (ref 3.87–5.11)
RDW: 13 % (ref 11.5–15.5)
WBC: 13 10*3/uL — AB (ref 4.0–10.5)

## 2015-04-06 ENCOUNTER — Ambulatory Visit (INDEPENDENT_AMBULATORY_CARE_PROVIDER_SITE_OTHER): Payer: BLUE CROSS/BLUE SHIELD | Admitting: Physician Assistant

## 2015-04-06 ENCOUNTER — Encounter: Payer: Self-pay | Admitting: Physician Assistant

## 2015-04-06 ENCOUNTER — Inpatient Hospital Stay (HOSPITAL_COMMUNITY): Payer: BLUE CROSS/BLUE SHIELD

## 2015-04-06 ENCOUNTER — Inpatient Hospital Stay (HOSPITAL_COMMUNITY)
Admission: AD | Admit: 2015-04-06 | Discharge: 2015-04-09 | DRG: 392 | Disposition: A | Discharge status: Home / Self Care | Payer: BLUE CROSS/BLUE SHIELD | Source: Ambulatory Visit | Attending: Internal Medicine | Admitting: Internal Medicine

## 2015-04-06 ENCOUNTER — Encounter (HOSPITAL_COMMUNITY): Payer: Self-pay

## 2015-04-06 VITALS — BP 118/80 | HR 76 | Temp 98.1°F | Ht 63.25 in | Wt 160.5 lb

## 2015-04-06 DIAGNOSIS — K589 Irritable bowel syndrome without diarrhea: Secondary | ICD-10-CM | POA: Diagnosis present

## 2015-04-06 DIAGNOSIS — R1032 Left lower quadrant pain: Secondary | ICD-10-CM | POA: Insufficient documentation

## 2015-04-06 DIAGNOSIS — F329 Major depressive disorder, single episode, unspecified: Secondary | ICD-10-CM | POA: Diagnosis present

## 2015-04-06 DIAGNOSIS — F418 Other specified anxiety disorders: Secondary | ICD-10-CM

## 2015-04-06 DIAGNOSIS — F419 Anxiety disorder, unspecified: Secondary | ICD-10-CM | POA: Diagnosis present

## 2015-04-06 DIAGNOSIS — K5732 Diverticulitis of large intestine without perforation or abscess without bleeding: Secondary | ICD-10-CM | POA: Diagnosis present

## 2015-04-06 DIAGNOSIS — D72829 Elevated white blood cell count, unspecified: Secondary | ICD-10-CM | POA: Diagnosis not present

## 2015-04-06 DIAGNOSIS — E876 Hypokalemia: Secondary | ICD-10-CM | POA: Diagnosis present

## 2015-04-06 DIAGNOSIS — F4323 Adjustment disorder with mixed anxiety and depressed mood: Secondary | ICD-10-CM | POA: Diagnosis not present

## 2015-04-06 DIAGNOSIS — K5792 Diverticulitis of intestine, part unspecified, without perforation or abscess without bleeding: Secondary | ICD-10-CM | POA: Diagnosis not present

## 2015-04-06 LAB — COMPREHENSIVE METABOLIC PANEL
ALK PHOS: 110 U/L (ref 38–126)
ALT: 18 U/L (ref 14–54)
ANION GAP: 11 (ref 5–15)
AST: 20 U/L (ref 15–41)
Albumin: 4.4 g/dL (ref 3.5–5.0)
BUN: <5 mg/dL — ABNORMAL LOW (ref 6–20)
CALCIUM: 9.3 mg/dL (ref 8.9–10.3)
CHLORIDE: 103 mmol/L (ref 101–111)
CO2: 26 mmol/L (ref 22–32)
Creatinine, Ser: 0.77 mg/dL (ref 0.44–1.00)
GFR calc non Af Amer: >60 mL/min (ref >60)
GLUCOSE: 128 mg/dL — AB (ref 65–99)
Potassium: 3.7 mmol/L (ref 3.5–5.1)
SODIUM: 140 mmol/L (ref 135–145)
Total Bilirubin: 0.6 mg/dL (ref 0.3–1.2)
Total Protein: 7.5 g/dL (ref 6.5–8.1)

## 2015-04-06 LAB — PROTIME-INR
INR: 1.12 (ref 0.00–1.49)
Prothrombin Time: 14.5 seconds (ref 11.6–15.2)

## 2015-04-06 LAB — CBC WITH DIFFERENTIAL/PLATELET
Basophils Absolute: 0 10*3/uL (ref 0.0–0.1)
Basophils Relative: 0 % (ref 0–1)
Eosinophils Absolute: 0.2 10*3/uL (ref 0.0–0.7)
Eosinophils Relative: 2 % (ref 0–5)
HCT: 42.1 % (ref 36.0–46.0)
Hemoglobin: 13.8 g/dL (ref 12.0–15.0)
Lymphocytes Relative: 22 % (ref 12–46)
Lymphs Abs: 2.2 10*3/uL (ref 0.7–4.0)
MCH: 28.7 pg (ref 26.0–34.0)
MCHC: 32.8 g/dL (ref 30.0–36.0)
MCV: 87.5 fL (ref 78.0–100.0)
MONO ABS: 0.8 10*3/uL (ref 0.1–1.0)
MONOS PCT: 8 % (ref 3–12)
Neutro Abs: 6.7 10*3/uL (ref 1.7–7.7)
Neutrophils Relative %: 68 % (ref 43–77)
PLATELETS: 357 10*3/uL (ref 150–400)
RBC: 4.81 MIL/uL (ref 3.87–5.11)
RDW: 12.5 % (ref 11.5–15.5)
WBC: 9.9 10*3/uL (ref 4.0–10.5)

## 2015-04-06 LAB — TSH: TSH: 1.524 u[IU]/mL (ref 0.350–4.500)

## 2015-04-06 LAB — APTT: APTT: 33 s (ref 24–37)

## 2015-04-06 LAB — PHOSPHORUS: PHOSPHORUS: 3.5 mg/dL (ref 2.5–4.6)

## 2015-04-06 LAB — MAGNESIUM: Magnesium: 2.4 mg/dL (ref 1.7–2.4)

## 2015-04-06 MED ORDER — IOHEXOL 300 MG/ML  SOLN
100.0000 mL | Freq: Once | INTRAMUSCULAR | Status: AC | PRN
  Administered 2015-04-06: 100 mL via INTRAVENOUS

## 2015-04-06 MED ORDER — ZOLPIDEM TARTRATE 5 MG PO TABS
5.0000 mg | ORAL_TABLET | Freq: Every day | ORAL | Status: DC
  Administered 2015-04-06 – 2015-04-08 (×3): 5 mg via ORAL
  Filled 2015-04-06 (×3): qty 1

## 2015-04-06 MED ORDER — LORAZEPAM 1 MG PO TABS
1.0000 mg | ORAL_TABLET | Freq: Two times a day (BID) | ORAL | Status: DC
  Administered 2015-04-06 – 2015-04-09 (×4): 1 mg via ORAL
  Filled 2015-04-06 (×5): qty 1

## 2015-04-06 MED ORDER — ACETAMINOPHEN 325 MG PO TABS
650.0000 mg | ORAL_TABLET | Freq: Four times a day (QID) | ORAL | Status: DC | PRN
  Administered 2015-04-09: 650 mg via ORAL
  Filled 2015-04-06: qty 2

## 2015-04-06 MED ORDER — LIP MEDEX EX OINT
TOPICAL_OINTMENT | CUTANEOUS | Status: AC
  Filled 2015-04-06: qty 7

## 2015-04-06 MED ORDER — ONDANSETRON HCL 4 MG PO TABS: 4.0000 mg | ORAL_TABLET | Freq: Four times a day (QID) | ORAL | Status: DC | PRN

## 2015-04-06 MED ORDER — PIPERACILLIN-TAZOBACTAM 3.375 G IVPB
3.3750 g | Freq: Three times a day (TID) | INTRAVENOUS | Status: DC
  Administered 2015-04-06 – 2015-04-08 (×6): 3.375 g via INTRAVENOUS
  Filled 2015-04-06 (×7): qty 50

## 2015-04-06 MED ORDER — ONDANSETRON HCL 4 MG/2ML IJ SOLN
4.0000 mg | Freq: Four times a day (QID) | INTRAMUSCULAR | Status: DC | PRN
  Administered 2015-04-06 – 2015-04-07 (×3): 4 mg via INTRAVENOUS
  Filled 2015-04-06 (×4): qty 2

## 2015-04-06 MED ORDER — KETOROLAC TROMETHAMINE 30 MG/ML IJ SOLN
30.0000 mg | Freq: Four times a day (QID) | INTRAMUSCULAR | Status: DC | PRN
  Administered 2015-04-06 – 2015-04-08 (×4): 30 mg via INTRAVENOUS
  Filled 2015-04-06 (×4): qty 1

## 2015-04-06 MED ORDER — IOHEXOL 300 MG/ML  SOLN
25.0000 mL | INTRAMUSCULAR | Status: AC
  Administered 2015-04-06 (×2): 25 mL via ORAL

## 2015-04-06 MED ORDER — FLUOXETINE HCL 20 MG PO TABS
20.0000 mg | ORAL_TABLET | Freq: Every day | ORAL | Status: DC
  Administered 2015-04-09: 20 mg via ORAL
  Filled 2015-04-06 (×4): qty 1

## 2015-04-06 MED ORDER — ALIGN 4 MG PO CAPS: 4.0000 mg | ORAL_CAPSULE | Freq: Every day | ORAL | Status: DC

## 2015-04-06 MED ORDER — VITAMIN D3 25 MCG (1000 UNIT) PO TABS
3000.0000 [IU] | ORAL_TABLET | Freq: Two times a day (BID) | ORAL | Status: DC
  Administered 2015-04-07: 3000 [IU] via ORAL
  Filled 2015-04-06 (×7): qty 3

## 2015-04-06 MED ORDER — ONDANSETRON HCL 4 MG/2ML IJ SOLN: 4.0000 mg | Freq: Four times a day (QID) | INTRAMUSCULAR | Status: DC | PRN

## 2015-04-06 MED ORDER — ACETAMINOPHEN 650 MG RE SUPP: 650.0000 mg | Freq: Four times a day (QID) | RECTAL | Status: DC | PRN

## 2015-04-06 MED ORDER — RISAQUAD PO CAPS
1.0000 | ORAL_CAPSULE | Freq: Every day | ORAL | Status: DC
Start: 2015-04-06 — End: 2015-04-06
  Filled 2015-04-06: qty 1

## 2015-04-06 MED ORDER — SENNOSIDES-DOCUSATE SODIUM 8.6-50 MG PO TABS: 1.0000 | ORAL_TABLET | Freq: Every evening | ORAL | Status: DC | PRN

## 2015-04-06 MED ORDER — RISAQUAD PO CAPS
1.0000 | ORAL_CAPSULE | Freq: Every day | ORAL | Status: DC
  Filled 2015-04-06: qty 1

## 2015-04-06 MED ORDER — DICYCLOMINE HCL 10 MG PO CAPS
10.0000 mg | ORAL_CAPSULE | ORAL | Status: DC
  Administered 2015-04-09: 10 mg via ORAL
  Filled 2015-04-06 (×2): qty 1

## 2015-04-06 MED ORDER — SODIUM CHLORIDE 0.9 % IV SOLN
INTRAVENOUS | Status: DC
  Administered 2015-04-06: 13:00:00 via INTRAVENOUS

## 2015-04-06 MED ORDER — PANTOPRAZOLE SODIUM 40 MG IV SOLR
40.0000 mg | INTRAVENOUS | Status: DC
  Administered 2015-04-06: 40 mg via INTRAVENOUS
  Filled 2015-04-06 (×2): qty 40

## 2015-04-06 MED ORDER — METRONIDAZOLE IN NACL 5-0.79 MG/ML-% IV SOLN
500.0000 mg | Freq: Three times a day (TID) | INTRAVENOUS | Status: DC
  Administered 2015-04-06: 500 mg via INTRAVENOUS
  Filled 2015-04-06 (×2): qty 100

## 2015-04-06 MED ORDER — CIPROFLOXACIN IN D5W 400 MG/200ML IV SOLN
400.0000 mg | Freq: Two times a day (BID) | INTRAVENOUS | Status: DC
  Filled 2015-04-06 (×2): qty 200

## 2015-04-06 NOTE — Patient Instructions (Addendum)
[facility]

## 2015-04-06 NOTE — Progress Notes (Signed)
ANTIBIOTIC CONSULT NOTE - INITIAL  Pharmacy Consult for ciprofloxacin Indication: intra-abdominal  Allergies  Allergen Reactions  . Codeine     Other reaction(s): Other Esophageal spasm  . Metronidazole Nausea And Vomiting    Able to tolerate brand Flagyl  . Wheat Dextrin     REACTION: tongue swelling  . Oxycodone-Acetaminophen Diarrhea    Able to tolerate brand Tylox    Patient Measurements: Height: 5\' 3"  (160 cm) Weight: 160 lb 8 oz (72.802 kg) IBW/kg (Calculated) : 52.4 Adjusted Body Weight:   Vital Signs: Temp: 98.2 F (36.8 C) (07/20 1118) Temp Source: Oral (07/20 1118) BP: 134/70 mmHg (07/20 1118) Pulse Rate: 63 (07/20 1118) Intake/Output from previous day:   Intake/Output from this shift:    Labs:  Recent Labs  04/05/15 1323  WBC 13.0*  HGB 15.3*  PLT 489.0*   Estimated Creatinine Clearance: 72.4 mL/min (by C-G formula based on Cr of 0.67). No results for input(s): VANCOTROUGH, VANCOPEAK, VANCORANDOM, GENTTROUGH, GENTPEAK, GENTRANDOM, TOBRATROUGH, TOBRAPEAK, TOBRARND, AMIKACINPEAK, AMIKACINTROU, AMIKACIN in the last 72 hours.   Microbiology: No results found for this or any previous visit (from the past 720 hour(s)).  Medical History: Past Medical History  Diagnosis Date  . Fibromyalgia   . Insomnia   . Cervical disc disease   . Anxiety and depression   . Recurrent vaginitis   . IBS (irritable bowel syndrome)   . Post-cholecystectomy syndrome   . Ileus    Assessment: 89 YOF presents admitted from GI office, presented for abd pain.  No notes available current but recent CT scan revealed diverticulitis.  Pharmacy asked to dose ciprofloxacin (on metronidazole)  7/20 >> ciprofloxacin  >> 7/20 >> metronidazole  >>    NO cultures currently ordered  Recent labs reveal normal SCr and slightly elevated WBC  Goal of Therapy:  Dose for indication and patient-specific parameters  Plan:  Ciprofloxacin 400mg  IV q12h  - pharmacy will follow at  distance as no dosage adjustment needed  Juliette Alcideustin Chantale Leugers, PharmD, BCPS.   Pager: [phone]  04/06/2015,11:37 AM

## 2015-04-06 NOTE — Progress Notes (Signed)
Pt had reaction in vein from cipro IV. Dr Elisabeth Pigeonevine notified. IV site had red streaks that dissipated after turning cipro off and running NS at 125.

## 2015-04-06 NOTE — Progress Notes (Signed)
Reviewed and discussed with you. I will follow in the hospital

## 2015-04-06 NOTE — Progress Notes (Signed)
Patient ID: Valerie Conway, female   DOB: Jul 30, 1956, 59 y.o.   MRN: [id number]     History of Present Illness: Valerie Conway is a 59 year old female who is known to Dr. Dickie La with a past history of diverticulosis, IBS, and GERD. She also has a history of hypertension, internal hemorrhoids, fibromyalgia, anxiety, depression, and insomnia. She was evaluated here in June with complaints of abdominal pain. She reported that several weeks prior to that visit while she was vacationing in [place] she developed severe abdominal pain. She had been drinking mojitos daily for 4 or 5 days when she developed epigastric and right-sided abdominal pain. [facility] [place] and had a CT that showed an ileus versus a small bowel obstruction. She vomited large amounts of fluid but says she did not require an NG tube. Her symptoms resolved in 2 days with IV fluids and bowel rest. Unfortunately upon discharge she quickly advanced her diet and had recurrent symptoms of vomiting and abdominal distention. She put herself on nothing by mouth for 24 hours and then stayed on clear liquids for several days and felt better. At her last visit she was continuing to have epigastric discomfort with early satiety. She was also having erratic bowel movements. She underwent a colonoscopy on 03/23/2015 and was noted to have a single ulcer ranging between 3-7 cm in the sigmoid. Severe diverticulosis was noted in the left colon with a partial colonic obstruction. Biopsy showed benign colonic mucosa with fibropurulent exudate consistent with an ulcer. She was started on a course of Cipro 250 mg twice a day. The patient had listed in her allergies that she was allergic to Flagyl and so she was not given Flagyl. Since that time however she reports that she is allergic only to certain generic forms" of Flagyl has never had a problem with random name Flagyl. On 03/23/2015 she also had an upper endoscopy that  showed LA class a esophagitis with gastritis in the antrum. Biopsies were negative for intestinal metaplasia or malignancy. She was advised to continue a PPI and Zantac. She had a CT of the abdomen and pelvis on July 11 that revealed changes of diverticulitis of the sigmoid. She was instructed to continue her Cipro but called the office several days later stating she would feel better if she had Flagyl and it was at that time that she explained what she perceived as a Flagyl allergy and Flagyl was subsequently called in for her. Unfortunately when she went to the pharmacy on July 14 to pick up her Flagyl, random name was not available and so she did not pick up the generic. She called the service on July 17 and spoke to Dr. Loreta Ave who was covering and another prescription for Flagyl was called in. She has been on Cipro for a week and a half and Flagyl for 3 days and presents today with worsening left lower quadrant abdominal pain. She is nauseous and states she has not been able to keep anything other than fluids down. She denies fever or chills but has been having some night sweats. She states her pain is a 3 or 4 on a scale of 1-10 but occasionally shoots up to a 7 or 8. She reports that she had a prior bout of diverticulitis years ago which was treated with Cipro and Flagyl. She reports that due to her busy schedule she is unable to frequently make doctor's appointments on demand and so she  states that she has a standing order for Cipro and Flagyl with her primary care physician and reports that she has had numerous bottles of old Cipro and Flagyl around her house and has treated herself numerous times over the past several years for what she has assumed was diverticulitis. The patient reports that she has had numerous friends with diverticular bleeds and states she is worried about developing a diverticular bleed as she is [employer]hov[employer]'s Witness and refuses blood.   Past Medical History  Diagnosis Date  .  Fibromyalgia   . Insomnia   . Cervical disc disease   . Anxiety and depression   . Recurrent vaginitis   . IBS (irritable bowel syndrome)   . Post-cholecystectomy syndrome   . Ileus     Past Surgical History  Procedure Laterality Date  . Cholecystectomy    . Mandible surgery     Family History  Problem Relation Age of Onset  . Alzheimer's disease Mother   . Pancreatitis Mother   . Gallbladder disease Mother   . Hypothyroidism Mother   . Colon cancer Maternal Uncle   . Prostate cancer Father   . Throat cancer Father   . Lung cancer Father   . Colon cancer Paternal Uncle    History  Substance Use Topics  . Smoking status: Never Smoker   . Smokeless tobacco: Never Used  . Alcohol Use: 0.0 oz/week    0 Standard drinks or equivalent per week     Comment: Social   Current Outpatient Prescriptions  Medication Sig Dispense Refill  . acetaminophen (TYLENOL) 325 MG tablet Take 650 mg by mouth every 6 (six) hours as needed.    . bismuth subsalicylate (PEPTO BISMOL) 262 MG chewable tablet Chew 524 mg by mouth 2 (two) times daily as needed.    . cholecalciferol (VITAMIN D) 1000 UNITS tablet Take 3,000 Units by mouth 2 (two) times daily.     . ciprofloxacin (CIPRO) 500 MG tablet Take 1 tablet (500 mg total) by mouth 2 (two) times daily. 20 tablet 2  . diazepam (VALIUM) 2 MG tablet Take 1 tablet (2 mg total) by mouth as needed for anxiety (take the night before procedure). 1 tablet 0  . dicyclomine (BENTYL) 10 MG capsule Take 10 mg by mouth every other day.    Marland Kitchen. FLUoxetine (PROZAC) 20 MG tablet Take 20 mg by mouth daily.    Marland Kitchen. LORazepam (ATIVAN) 1 MG tablet   1  . metroNIDAZOLE (FLAGYL) 500 MG tablet Take 1 tablet (500 mg total) by mouth 3 (three) times daily. 21 tablet 0  . NEXIUM 40 MG capsule Take 40 mg by mouth 2 (two) times daily.    . ondansetron (ZOFRAN) 4 MG tablet Take 4 mg by mouth 3 (three) times daily.    . Probiotic Product (ALIGN) 4 MG CAPS Take 4 mg by mouth daily.      . ranitidine (ZANTAC) 300 MG capsule   1  . traMADol (ULTRAM) 50 MG tablet Take 50 mg by mouth as needed. Pt not used yet    . zolpidem (AMBIEN) 10 MG tablet Take 10 mg by mouth.     No current facility-administered medications for this visit.   Allergies  Allergen Reactions  . Codeine     Other reaction(s): Other Esophageal spasm  . Metronidazole Nausea And Vomiting    Able to tolerate brand Flagyl  . Wheat Dextrin     REACTION: tongue swelling  . Oxycodone-Acetaminophen Diarrhea    Able  to tolerate brand Tylox      Review of Systems: Gen: Denies any fever, chills, sweats, anorexia, fatigue, weakness, malaise, weight loss, and sleep disorder CV: Denies chest pain, angina, palpitations, syncope, orthopnea, PND, peripheral edema, and claudication. Resp: Denies dyspnea at rest, dyspnea with exercise, cough, sputum, wheezing, coughing up blood, and pleurisy. GI: Denies vomiting blood, jaundice, and fecal incontinence.   Denies dysphagia or odynophagia. GU : Denies urinary burning, blood in urine, urinary frequency, urinary hesitancy, nocturnal urination, and urinary incontinence. MS: Denies joint pain, limitation of movement, and swelling, stiffness, low back pain, extremity pain. Denies muscle weakness, cramps, atrophy.  Derm: Denies rash, itching, dry skin, hives, moles, warts, or unhealing ulcers.  Psych: Denies depression, anxiety, memory loss, suicidal ideation, hallucinations, paranoia, and confusion. Heme: Denies bruising, bleeding, and enlarged lymph nodes. Neuro:  Denies any headaches, dizziness, paresthesia Endo:  Denies any problems with DM, thyroid, adrenal  LAB RESULTS:  Recent Labs  04/05/15 1323  WBC 13.0*  HGB 15.3*  HCT 45.5  PLT 489.0*      Studies:   Ct Abdomen Pelvis W Contrast  03/28/2015   CLINICAL DATA:  Recent partial small bowel obstruction. Persistent abdominal pain and cramping with nausea and vomiting. Colonoscopy last week showed a partial  colon obstruction and ulcer.  EXAM: CT ABDOMEN AND PELVIS WITH CONTRAST  TECHNIQUE: Multidetector CT imaging of the abdomen and pelvis was performed using the standard protocol following bolus administration of intravenous contrast.  CONTRAST:  OMNIPAQUE IOHEXOL 300 MG/ML  SOLN  COMPARISON:  04/22/2009.  FINDINGS: Lower chest: Lung bases show no acute findings. Heart size normal. No pericardial or pleural effusion.  Hepatobiliary: Liver is decreased in attenuation diffusely. Cholecystectomy. No biliary ductal dilatation.  Pancreas: Negative.  Spleen: Negative.  Adrenals/Urinary Tract: Adrenal glands and right kidney are unremarkable. Low-attenuation lesions in the left kidney measure up to 9 mm, too small to characterize but statistically, cysts are most likely. Ureters are decompressed. Bladder is unremarkable.  Stomach/Bowel: Stomach is unremarkable. Question a small 11 mm diverticulum off the distal duodenum (series 2, image 41). Small bowel, appendix and majority of the colon are otherwise unremarkable. Mild haziness is seen adjacent to the distal descending colon, without extraluminal air or fluid. Remainder of the colon is unremarkable.  Vascular/Lymphatic: Trace atherosclerotic calcification of the arterial vasculature. No pathologically enlarged lymph nodes.  Reproductive: Uterus and ovaries are visualized.  Other: No free fluid.  Mesenteries and peritoneum are unremarkable.  Musculoskeletal: No worrisome lytic or sclerotic lesions.  IMPRESSION: 1. Suspect mild changes of diverticulitis involving the distal descending colon. 2. No evidence of bowel obstruction. 3. Hepatic steatosis.   Electronically Signed   By: Leanna Battles M.D.   On: 03/28/2015 14:49     Physical Exam: General: Pleasant, well developed anxious Caucasian female in no acute distress Head: Normocephalic and atraumatic Eyes:  sclerae anicteric, conjunctiva pink  Ears: Normal auditory acuity Lungs: Clear throughout to  auscultation Heart: Regular rate and rhythm Abdomen: Soft, non distended, tender left lower quadrant and suprapubic area with guarding but no rebound. No masses, no hepatomegaly. Normal bowel sounds Musculoskeletal: Symmetrical with no gross deformities  Extremities: No edema  Neurological: Alert oriented x 4, grossly nonfocal Psychological:  Alert and cooperative. Normal mood and affect  Assessment and Recommendations: 59 year old female with past history of IBS and diverticular disease, recently found to have diverticulitis on CT, presenting for follow-up. Patient is quite distressed that she has not had significant improvement. She reportedly  has used Cipro and Flagyl sporadically over the past year on her own as she had some at home. At this time she is quite tender and is having loose stools. She reports 2 or 3 loose stools yesterday but no bowel movement today. Patient is to be admitted to [facility] for IV antiiotics and bowel rest. Repeat   CT scan will be obtained to see if there has been resolution of her diverticulitis, abscess formation, etc. Follow CBC. Patient to continue PPI as she was recently found to have gastritis as well.       Mitsuo Budnick, Tollie Pizza PA-C 04/06/2015,

## 2015-04-06 NOTE — H&P (Addendum)
[facility] History and Physical  Valerie Conway BJY:[id number] DOB: 1955/09/27 DOA: 04/06/2015  Referring physician: ER physician - Direct admission from [facility] office (per Dr. Juanda Chance request) PCP: Lenora Boys, MD  Chief Complaint: left lower quadrant abdominal pain  HPI:  59 year old female with past medical history of depression, irritable bowel syndrome. Patient was recently in [place] where she was treated for small bowel obstruction from 01/24/2015 through 01/26/2015. She was told to follow-up with GI office to make sure her symptoms are controlled. She has followed in [facility] and and the initial approach was to start with colonoscopy. Patient has had colonoscopy 03/23/2015 and it revealed single ulcer about 3-7 mm in size in the sigmoid colon and biopsies of the ulcer were taken but it was suspected that this was resolving diverticulitis. It was also seen that she has severe diverticulosis in the left colon with partial colon obstruction. Patient was started on ciprofloxacin and then she noted that her pain got worse in the left lower quadrant after this colonoscopy. She doesn't recall having diarrhea other than the day prior to this admission 2 episodes of watery to loose stool. She did not have bowel movement today. Because she didn't feel better she called GI office and asked to have Flagyl. Apparently it was reported that she has allergic reaction to Flagyl but patient says her reaction is nausea and indigestion. So she took Flagyl for last 3 days but continuing to be on ciprofloxacin. She was going to follow with GI office today and she did in because of worsening pain GI has requested admission to [facility] for further evaluation. At this time, patient reports left lower quadrant pain, about 3 out of 10 in intensity, constant and gnawing type of pain. Pain is nonradiating. She says that usually tramadol helps with pain relief. This time she is not taking any pain  medications because she is afraid she will develop small bowel obstruction. Pain is present at rest and with movement. She does not have nausea or vomiting at this time. No reports of blood in the stool or urine. No reports of fevers.  Please note that this is direct admission so we do not have admission blood work. She is admitted to medical floor and GI will continue to see the patient in consultation.  Assessment & Plan    Principal problem: Left lower quadrant abdominal pain / possible acute diverticulitis / leukocytosis - Patient with known history of diverticulosis. She has had recent colonoscopy that demonstrated severe diverticulosis in the left colon. - Per GI recommendations it may be worthwhile repeating CT scan for further evaluation considering that this pain is obviously a new type of pain - We will start it IV Cipro and Flagyl unless GI thinks patient does not need antibodies at this time - Continue supportive care with IV fluids and antiemetics as needed - Keep nothing by mouth for now - Pain management with Toradol IV every 6 hours as needed. We will avoid narcotics because of patient's relatively recent hospitalization for small bowel obstruction. - Continue Protonix 40 mg IV daily.  Active Problems: Anxiety and depression - Continue Prozac and Ativan   DVT prophylaxis:  - SCDs bilaterally  Radiological Exams on Admission: No results found.   Code Status: Full Family Communication: Plan of care discussed with the patient  Disposition Plan: Admit for further evaluation to medical floor   Manson Passey, MD  [facility] Pager [phone]  Time spent in minutes:  75 minutes  Review of Systems:  Constitutional: Negative for fever, chills and malaise/fatigue. Negative for diaphoresis.  HENT: Negative for hearing loss, ear pain, nosebleeds, congestion, sore throat, neck pain, tinnitus and ear discharge.   Eyes: Negative for blurred vision, double vision,  photophobia, pain, discharge and redness.  Respiratory: Negative for cough, hemoptysis, sputum production, shortness of breath, wheezing and stridor.   Cardiovascular: Negative for chest pain, palpitations, orthopnea, claudication and leg swelling.  Gastrointestinal: per HPI Genitourinary: Negative for dysuria, urgency, frequency, hematuria and flank pain.  Musculoskeletal: Negative for myalgias, back pain, joint pain and falls.  Skin: Negative for itching and rash.  Neurological: Negative for dizziness and weakness. Negative for tingling, tremors, sensory change, speech change, focal weakness, loss of consciousness and headaches.  Endo/Heme/Allergies: Negative for environmental allergies and polydipsia. Does not bruise/bleed easily.  Psychiatric/Behavioral: Negative for suicidal ideas. The patient is not nervous/anxious.      Past Medical History  Diagnosis Date  . Fibromyalgia   . Insomnia   . Cervical disc disease   . Anxiety and depression   . Recurrent vaginitis   . IBS (irritable bowel syndrome)   . Post-cholecystectomy syndrome   . Ileus    Past Surgical History  Procedure Laterality Date  . Cholecystectomy    . Mandible surgery     Social History:  reports that she has never smoked. She has never used smokeless tobacco. She reports that she drinks alcohol. She reports that she does not use illicit drugs.  Allergies  Allergen Reactions  . Codeine     Other reaction(s): Other Esophageal spasm  . Metronidazole Nausea And Vomiting    Able to tolerate brand Flagyl  . Wheat Dextrin     REACTION: tongue swelling  . Oxycodone-Acetaminophen Diarrhea    Able to tolerate brand Tylox    Family History:  Family History  Problem Relation Age of Onset  . Alzheimer's disease Mother   . Pancreatitis Mother   . Gallbladder disease Mother   . Hypothyroidism Mother   . Colon cancer Maternal Uncle   . Prostate cancer Father   . Throat cancer Father   . Lung cancer Father   .  Colon cancer Paternal Uncle      Prior to Admission medications   Medication Sig Start Date End Date Taking? Authorizing Provider  acetaminophen (TYLENOL) 500 MG tablet Take 1,000 mg by mouth daily as needed for mild pain, moderate pain or headache.   Yes Historical Provider, MD  bismuth subsalicylate (PEPTO BISMOL) 262 MG chewable tablet Chew 524 mg by mouth 2 (two) times daily as needed for indigestion.    Yes Historical Provider, MD  cholecalciferol (VITAMIN D) 1000 UNITS tablet Take 3,000 Units by mouth 2 (two) times daily.    Yes Historical Provider, MD  ciprofloxacin (CIPRO) 500 MG tablet Take 1 tablet (500 mg total) by mouth 2 (two) times daily. 03/25/15  Yes Hart Carwinora M Brodie, MD  dicyclomine (BENTYL) 10 MG capsule Take 10 mg by mouth every other day.   Yes Historical Provider, MD  dicyclomine (BENTYL) 20 MG tablet Take 10 mg by mouth daily.  03/18/15  Yes Historical Provider, MD  FLUoxetine (PROZAC) 20 MG tablet Take 20 mg by mouth daily.   Yes Historical Provider, MD  LORazepam (ATIVAN) 1 MG tablet Take 0.5 mg by mouth 2 (two) times daily as needed for anxiety.  11/29/14  Yes Historical Provider, MD  metroNIDAZOLE (FLAGYL) 500 MG tablet Take 1 tablet (500 mg total) by  mouth 3 (three) times daily. 03/31/15  Yes Lori P Hvozdovic, PA-C  NEXIUM 40 MG capsule Take 40 mg by mouth 2 (two) times daily. 01/05/15  Yes Historical Provider, MD  ondansetron (ZOFRAN) 4 MG tablet Take 4 mg by mouth 3 (three) times daily. 04/02/15  Yes Historical Provider, MD  OVER THE COUNTER MEDICATION Take 1 scoop by mouth daily.   Yes Historical Provider, MD  Probiotic Product (ALIGN) 4 MG CAPS Take 4 mg by mouth 3 (three) times daily.    Yes Historical Provider, MD  ranitidine (ZANTAC) 300 MG capsule Take 300 mg by mouth 2 (two) times a week. PRN heartburn, indigestion 12/02/14  Yes Historical Provider, MD  zolpidem (AMBIEN) 10 MG tablet Take 10 mg by mouth. 07/17/13  Yes Historical Provider, MD  traMADol (ULTRAM) 50 MG  tablet Take 50 mg by mouth as needed. Pt not used yet 04/02/15   Historical Provider, MD   Physical Exam: Filed Vitals:   04/06/15 1118  BP: 134/70  Pulse: 63  Temp: 98.2 F (36.8 C)  TempSrc: Oral  Resp: 18  Height: 5\' 3"  (1.6 m)  Weight: 72.802 kg (160 lb 8 oz)  SpO2: 99%    Physical Exam  Constitutional: Appears well-developed and well-nourished. No distress.  HENT: Normocephalic. No tonsillar erythema or exudates Eyes: Conjunctivae are normal. No scleral icterus.  Neck: Normal ROM. Neck supple. No JVD. No tracheal deviation. No thyromegaly.  CVS: RRR, S1/S2 +, no murmurs, no gallops, no carotid bruit.  Pulmonary: Effort and breath sounds normal, no stridor, rhonchi, wheezes, rales.  Abdominal: Soft. BS +,  no distension, tenderness in left lower quadrant without rebound or guarding  Musculoskeletal: Normal range of motion. No edema and no tenderness.  Lymphadenopathy: No lymphadenopathy noted, cervical, inguinal. Neuro: Alert. Normal reflexes, muscle tone coordination. No focal neurologic deficits. Skin: Skin is warm and dry. No rash noted.  No erythema. No pallor.  Psychiatric: Normal mood and affect. Behavior, judgment, thought content normal.   Labs on Admission:  Basic Metabolic Panel: No results for input(s): NA, K, CL, CO2, GLUCOSE, BUN, CREATININE, CALCIUM, MG, PHOS in the last 168 hours. Liver Function Tests: No results for input(s): AST, ALT, ALKPHOS, BILITOT, PROT, ALBUMIN in the last 168 hours. No results for input(s): LIPASE, AMYLASE in the last 168 hours. No results for input(s): AMMONIA in the last 168 hours. CBC:  Recent Labs Lab 04/05/15 1323  WBC 13.0*  NEUTROABS 9.8*  HGB 15.3*  HCT 45.5  MCV 87.8  PLT 489.0*   Cardiac Enzymes: No results for input(s): CKTOTAL, CKMB, CKMBINDEX, TROPONINI in the last 168 hours. BNP: Invalid input(s): POCBNP CBG: No results for input(s): GLUCAP in the last 168 hours.  If 7PM-7AM, please contact  night-coverage [email or web address] Password [email or web address] 04/06/2015, 12:00 PM

## 2015-04-06 NOTE — Progress Notes (Signed)
ANTIBIOTIC CONSULT NOTE - INITIAL  Pharmacy Consult for zosyn Indication: intra-abdominal  Allergies  Allergen Reactions  . Codeine     Other reaction(s): Other Esophageal spasm  . Metronidazole Nausea And Vomiting    Able to tolerate brand Flagyl  . Wheat Dextrin     REACTION: tongue swelling  . Oxycodone-Acetaminophen Diarrhea    Able to tolerate brand Tylox    Patient Measurements: Height: 5\' 3"  (160 cm) Weight: 160 lb 8 oz (72.802 kg) IBW/kg (Calculated) : 52.4 Adjusted Body Weight:   Vital Signs: Temp: 98.1 F (36.7 C) (07/20 1400) Temp Source: Oral (07/20 1400) BP: 136/74 mmHg (07/20 1400) Pulse Rate: 61 (07/20 1400) Intake/Output from previous day:   Intake/Output from this shift:    Labs:  Recent Labs  04/05/15 1323 04/06/15 1210  WBC 13.0* 9.9  HGB 15.3* 13.8  PLT 489.0* 357  CREATININE  --  0.77   Estimated Creatinine Clearance: 72.4 mL/min (by C-G formula based on Cr of 0.77). No results for input(s): VANCOTROUGH, VANCOPEAK, VANCORANDOM, GENTTROUGH, GENTPEAK, GENTRANDOM, TOBRATROUGH, TOBRAPEAK, TOBRARND, AMIKACINPEAK, AMIKACINTROU, AMIKACIN in the last 72 hours.   Microbiology: No results found for this or any previous visit (from the past 720 hour(s)).  Medical History: Past Medical History  Diagnosis Date  . Fibromyalgia   . Insomnia   . Cervical disc disease   . Anxiety and depression   . Recurrent vaginitis   . IBS (irritable bowel syndrome)   . Post-cholecystectomy syndrome   . Ileus    Assessment: Valerie Conway presents admitted from GI office, presented for abd pain.  No notes available current but recent CT scan revealed diverticulitis.  Pharmacy asked to dose zosyn, was on ciprofloxacin but had a reaction.  Also on metronidazole.  7/20 >> ciprofloxacin  >> 7/20 7/20 >> metronidazole  >>   7/20 >> zosyn >>  NO cultures currently ordered  Recent labs reveal normal SCr and slightly elevated WBC  Goal of Therapy:  Dose for  indication and patient-specific parameters  Plan:  Zosyn 3.375mg  IV q8h extended interval  - pharmacy will follow at distance as no dosage adjustment needed  Arley Phenixllen Matheu Ploeger RPh 04/06/2015, 4:26 PM Pager [phone]

## 2015-04-06 NOTE — Progress Notes (Signed)
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

## 2015-04-07 DIAGNOSIS — R1032 Left lower quadrant pain: Secondary | ICD-10-CM | POA: Insufficient documentation

## 2015-04-07 DIAGNOSIS — E876 Hypokalemia: Secondary | ICD-10-CM

## 2015-04-07 DIAGNOSIS — F4323 Adjustment disorder with mixed anxiety and depressed mood: Secondary | ICD-10-CM

## 2015-04-07 LAB — COMPREHENSIVE METABOLIC PANEL
ALT: 16 U/L (ref 14–54)
AST: 19 U/L (ref 15–41)
Albumin: 3.7 g/dL (ref 3.5–5.0)
Alkaline Phosphatase: 93 U/L (ref 38–126)
Anion gap: 7 (ref 5–15)
BILIRUBIN TOTAL: 0.7 mg/dL (ref 0.3–1.2)
BUN: 6 mg/dL (ref 6–20)
CHLORIDE: 104 mmol/L (ref 101–111)
CO2: 28 mmol/L (ref 22–32)
Calcium: 9.1 mg/dL (ref 8.9–10.3)
Creatinine, Ser: 0.81 mg/dL (ref 0.44–1.00)
GFR calc Af Amer: >60 mL/min (ref >60)
GFR calc non Af Amer: >60 mL/min (ref >60)
Glucose, Bld: 116 mg/dL — ABNORMAL HIGH (ref 65–99)
Potassium: 3.4 mmol/L — ABNORMAL LOW (ref 3.5–5.1)
Sodium: 139 mmol/L (ref 135–145)
Total Protein: 6.6 g/dL (ref 6.5–8.1)

## 2015-04-07 LAB — CBC
HCT: 38.4 % (ref 36.0–46.0)
HEMOGLOBIN: 12.4 g/dL (ref 12.0–15.0)
MCH: 28.3 pg (ref 26.0–34.0)
MCHC: 32.3 g/dL (ref 30.0–36.0)
MCV: 87.7 fL (ref 78.0–100.0)
Platelets: 354 10*3/uL (ref 150–400)
RBC: 4.38 MIL/uL (ref 3.87–5.11)
RDW: 12.6 % (ref 11.5–15.5)
WBC: 10.5 10*3/uL (ref 4.0–10.5)

## 2015-04-07 LAB — GLUCOSE, CAPILLARY: GLUCOSE-CAPILLARY: 114 mg/dL — AB (ref 65–99)

## 2015-04-07 MED ORDER — POTASSIUM CHLORIDE 10 MEQ/100ML IV SOLN
10.0000 meq | INTRAVENOUS | Status: AC
  Administered 2015-04-07 (×2): 10 meq via INTRAVENOUS
  Filled 2015-04-07 (×2): qty 100

## 2015-04-07 MED ORDER — ALIGN 4 MG PO CAPS
4.0000 mg | ORAL_CAPSULE | Freq: Every day | ORAL | Status: DC
  Filled 2015-04-07: qty 1

## 2015-04-07 MED ORDER — ALIGN PO CAPS
1.0000 | ORAL_CAPSULE | Freq: Every day | ORAL | Status: DC
Start: 2015-04-07 — End: 2015-04-07
  Filled 2015-04-07: qty 1

## 2015-04-07 MED ORDER — ALIGN 4 MG PO CAPS: 4.0000 mg | ORAL_CAPSULE | Freq: Every day | ORAL | Status: DC

## 2015-04-07 MED ORDER — ALIGN 4 MG PO CAPS
4.0000 mg | ORAL_CAPSULE | Freq: Two times a day (BID) | ORAL | Status: DC
  Administered 2015-04-07: 4 mg via ORAL
  Administered 2015-04-07 – 2015-04-08 (×3): 1 via ORAL
  Administered 2015-04-09: 4 mg via ORAL
  Filled 2015-04-07 (×6): qty 1

## 2015-04-07 MED ORDER — PROMETHAZINE HCL 25 MG/ML IJ SOLN
25.0000 mg | Freq: Four times a day (QID) | INTRAMUSCULAR | Status: DC | PRN
  Administered 2015-04-07 – 2015-04-08 (×4): 25 mg via INTRAVENOUS
  Filled 2015-04-07 (×4): qty 1

## 2015-04-07 MED ORDER — PANTOPRAZOLE SODIUM 40 MG IV SOLR
40.0000 mg | Freq: Two times a day (BID) | INTRAVENOUS | Status: DC
  Administered 2015-04-07 – 2015-04-09 (×4): 40 mg via INTRAVENOUS
  Filled 2015-04-07 (×6): qty 40

## 2015-04-07 MED ORDER — ALIGN 4 MG PO CAPS
1.0000 | ORAL_CAPSULE | Freq: Every day | ORAL | Status: DC
  Administered 2015-04-07: 1 via ORAL

## 2015-04-07 NOTE — Progress Notes (Signed)
[facility] Progress Note  Subjective:  Pain at most this AM was 2-3/10.  No pain currently.  No BM since admission.  Complaining that Zosyn is making her nauseous.  Objective:  Vital signs in last 24 hours: Temp:  [98.1 F (36.7 C)-98.2 F (36.8 C)] 98.1 F (36.7 C) (07/21 0528) Pulse Rate:  [61-66] 62 (07/21 0528) Resp:  [14-18] 16 (07/21 0528) BP: (121-136)/(63-74) 121/63 mmHg (07/21 0528) SpO2:  [98 %-100 %] 98 % (07/21 0528) Weight:  [160 lb 8 oz (72.802 kg)] 160 lb 8 oz (72.802 kg) (07/20 1118) Last BM Date: 04/05/15 General:  Alert, Well-developed, in NAD; resting comfortably Heart:  Regular rate and rhythm; no murmurs Pulm:  CTAB.  No W/R/R. Abdomen:  Soft, non-distended.  BS present.  Non-tender. Extremities:  Without edema. Neurologic:  Alert and  oriented x4;  grossly normal neurologically. Psych:  Alert and cooperative. Normal mood and affect.  Intake/Output from previous day: 07/20 0701 - 07/21 0700 In: 1391.3 [I.V.:1291.3; IV Piggyback:100] Out: 400 [Urine:400] Intake/Output this shift: Total I/O In: 0  Out: 300 [Urine:300]  Lab Results:  Recent Labs  04/05/15 1323 04/06/15 1210 04/07/15 0435  WBC 13.0* 9.9 10.5  HGB 15.3* 13.8 12.4  HCT 45.5 42.1 38.4  PLT 489.0* 357 354   BMET  Recent Labs  04/06/15 1210 04/07/15 0435  NA 140 139  K 3.7 3.4*  CL 103 104  CO2 26 28  GLUCOSE 128* 116*  BUN <5* 6  CREATININE 0.77 0.81  CALCIUM 9.3 9.1   LFT  Recent Labs  04/07/15 0435  PROT 6.6  ALBUMIN 3.7  AST 19  ALT 16  ALKPHOS 93  BILITOT 0.7   PT/INR  Recent Labs  04/06/15 1210  LABPROT 14.5  INR 1.12   Ct Abdomen Pelvis W Contrast  04/06/2015   CLINICAL DATA:  LEFT lower quadrant pain with nausea, leukocytosis, history diverticulitis  EXAM: CT ABDOMEN AND PELVIS WITH CONTRAST  TECHNIQUE: Multidetector CT imaging of the abdomen and pelvis was performed using the standard protocol following bolus administration of  intravenous contrast. Sagittal and coronal MPR images reconstructed from axial data set.  CONTRAST:  OMNIPAQUE IOHEXOL 300 MG/ML SOLN IV. Dilute oral contrast.  COMPARISON:  03/28/2015  FINDINGS: Lung bases clear.  Gallbladder surgically absent.  Minimal fatty infiltration of liver.  Small LEFT renal cysts.  Liver, spleen, pancreas, kidneys, and adrenal glands otherwise normal appearance.  Normal appendix.  Distal colonic diverticulosis with minimal sigmoid wall thickening.  Pericolic inflammatory changes seen previously have resolved.  Stomach and remaining bowel loops normal appearance.  Decompressed bladder, distal ureters, uterus and adnexa unremarkable.  No mass, adenopathy, free fluid, or free air.  Tiny umbilical hernia containing fat.  Osseous structures unremarkable.  IMPRESSION: Distal colonic diverticulosis with mild wall thickening at the sigmoid colon similar distribution to that seen on the previous exam likely representing residual changes from acute diverticulitis.  Resolution of pericolic inflammatory changes seen on previous exam.  Minimal fatty infiltration of liver.  Small LEFT renal cysts.  Tiny umbilical hernia containing fat.   Electronically Signed   By: Ulyses Southward M.D.   On: 04/06/2015 18:45    Assessment / Plan: *59 year old female with past history of IBS and diverticular disease, recently found to have diverticulitis on CT.  Treated as outpatient with cipro and flagyl with minimal reported improvement.  Repeat CT scan 7/20 showed radiographic improvement/near resolution, however.  Labs unremarkable.  Was admitted for  IV antibiotics.  Had apparent reaction to IV cipro so cipro and flagyl discontinued and she was placed on Zosyn.  Diet advanced to clear liquids so we will see how she does with that.  Anticipate discharge soon.  May need to go home on Augmentin since she did not have subjective improvement with PO cipro and flagyl.   LOS: 1 day   ZEHR, JESSICA D.  04/07/2015,  9:11 AM  Pager number [phone] Attending MD note:   I have taken a history, examined the patient, and reviewed the chart. I agree with the Advanced Practitioner's impression and recommendations. Abdominal pain initially evaluated in [place] ED in May, never improved, has taken several courses of antibiotics, claims some allergies so not a complete compliance. Small ulcer in the sigmoid colon in the midst of diverticulosis  suggestive of diverticulitis. I agree with Zosyn, IV , bowl rest. She appears to be frustrated about not getting better.I  Think some functional component to her complaints.  [facility] Pager # [phone]

## 2015-04-07 NOTE — Progress Notes (Addendum)
Patient ID: Valerie Conway, female   DOB: 19-Feb-1956, 59 y.o.   MRN: [id number] [facility] PROGRESS NOTE  Valerie Conway NWG:[id number] DOB: Apr 15, 1956 DOA: 04/06/2015 PCP: Lenora Boys, MD  Brief narrative:    59 year old female with past medical history of depression, irritable bowel syndrome. Patient was recently in [place] where she was treated for small bowel obstruction from 01/24/2015 through 01/26/2015. She was told to follow-up with GI office to make sure her symptoms are controlled. She has followed in [facility] and and the initial approach was to start with colonoscopy. Patient has had colonoscopy 03/23/2015 and it revealed single ulcer about 3-7 mm in size in the sigmoid colon and biopsies of the ulcer were taken but it was suspected that this was resolving diverticulitis. She also had severe diverticulosis in the left colon with partial colon obstruction on colonoscopy. Patient was started on ciprofloxacin and then she noted that her pain got worse in the left lower quadrant after this colonoscopy. She was then started on flagyl which she took for about 3 days with last dose prior to this admission. She presented to GI office 04/06/2015 for follow up and because of complaints of ongoing abdominal pain she was referred to [facility] for evaluation.  Anticipated discharge: Once nausea and abdominal pain improves she can go home. Anticipate D/C by 04/08/2015.   Assessment/Plan:    Principal problem: Left lower quadrant abdominal pain / possible acute diverticulitis / leukocytosis - Patient with known history of diverticulosis. She has had recent colonoscopy that demonstrated severe diverticulosis in the left colon. - CT abdomen on this admission reveals residual acute diverticulitis. - She was started on IV Cipro and Flagyl on the admission but has developed questionable allergic reaction to Cipro so abx changed to Zosyn. - Advance diet to clear liquids - Continue supportive care with  antiemetics as needed. - Continue Protonix 40 mg IV twice daily.  Active Problems: Anxiety and depression - Continue Prozac and Ativan  Hypokalemia - Secondary to GI losses - Supplemented   DVT prophylaxis:  - SCDs bilaterally   Code Status: Full.  Family Communication:  plan of care discussed with the patient Disposition Plan: Home once GI symptoms improve.   IV access:  Peripheral IV  Procedures and diagnostic studies:    Ct Abdomen Pelvis W Contrast 04/06/2015  Distal colonic diverticulosis with mild wall thickening at the sigmoid colon similar distribution to that seen on the previous exam likely representing residual changes from acute diverticulitis.  Resolution of pericolic inflammatory changes seen on previous exam.  Minimal fatty infiltration of liver.  Small LEFT renal cysts.  Tiny umbilical hernia containing fat.      Medical Consultants:  Gastroenterology, Dr. Lina Sar  Other Consultants:  None   IAnti-Infectives:   Cirpo and flagyl 7/20 --> 7/20 Zosyn 04/06/2015 -->    Kelcy Laible, MD  [facility] Pager [phone]  Time spent in minutes: 25 minutes  If 7PM-7AM, please contact night-coverage [email or web address] Password [email or web address] 04/07/2015, 11:22 AM   LOS: 1 day    HPI/Subjective: No acute overnight events. Patient reports feeling nausea but no vomiting.  Objective: Filed Vitals:   04/06/15 1118 04/06/15 1400 04/06/15 2146 04/07/15 0528  BP: 134/70 136/74 122/71 121/63  Pulse: 63 61 66 62  Temp: 98.2 F (36.8 C) 98.1 F (36.7 C) 98.1 F (36.7 C) 98.1 F (36.7 C)  TempSrc: Oral Oral Oral Oral  Resp: 18 18 14 16   Height: 5\' 3"  (1.6 m)  Weight: 72.802 kg (160 lb 8 oz)     SpO2: 99% 100% 100% 98%    Intake/Output Summary (Last 24 hours) at 04/07/15 1122 Last data filed at 04/07/15 1018  Gross per 24 hour  Intake 1691.25 ml  Output    700 ml  Net 991.25 ml    Exam:   General:  Pt is alert, follows commands appropriately, not in  acute distress  Cardiovascular: Regular rate and rhythm, S1/S2, no murmurs  Respiratory: Clear to auscultation bilaterally, no wheezing, no crackles, no rhonchi  Abdomen: Soft, non tender, non distended, bowel sounds present  Extremities: No edema, pulses DP and PT palpable bilaterally  Neuro: Grossly nonfocal  Data Reviewed: Basic Metabolic Panel:  Recent Labs Lab 04/06/15 1210 04/07/15 0435  NA 140 139  K 3.7 3.4*  CL 103 104  CO2 26 28  GLUCOSE 128* 116*  BUN <5* 6  CREATININE 0.77 0.81  CALCIUM 9.3 9.1  MG 2.4  --   PHOS 3.5  --    Liver Function Tests:  Recent Labs Lab 04/06/15 1210 04/07/15 0435  AST 20 19  ALT 18 16  ALKPHOS 110 93  BILITOT 0.6 0.7  PROT 7.5 6.6  ALBUMIN 4.4 3.7   No results for input(s): LIPASE, AMYLASE in the last 168 hours. No results for input(s): AMMONIA in the last 168 hours. CBC:  Recent Labs Lab 04/05/15 1323 04/06/15 1210 04/07/15 0435  WBC 13.0* 9.9 10.5  NEUTROABS 9.8* 6.7  --   HGB 15.3* 13.8 12.4  HCT 45.5 42.1 38.4  MCV 87.8 87.5 87.7  PLT 489.0* 357 354   Cardiac Enzymes: No results for input(s): CKTOTAL, CKMB, CKMBINDEX, TROPONINI in the last 168 hours. BNP: Invalid input(s): POCBNP CBG:  Recent Labs Lab 04/07/15 0758  GLUCAP 114*    No results found for this or any previous visit (from the past 240 hour(s)).   Scheduled Meds: . ALIGN  4 mg Oral BID  . cholecalciferol  3,000 Units Oral BID  . dicyclomine  10 mg Oral QODAY  . FLUoxetine  20 mg Oral Daily  . LORazepam  1 mg Oral BID  . pantoprazole (PROTONIX) IV  40 mg Intravenous Q12H  . piperacillin-tazobactam (ZOSYN)  IV  3.375 g Intravenous Q8H  . potassium chloride  10 mEq Intravenous Q1 Hr x 2  . zolpidem  5 mg Oral QHS   Continuous Infusions: . sodium chloride 75 mL/hr at 04/06/15 1247

## 2015-04-08 LAB — GLUCOSE, CAPILLARY: Glucose-Capillary: 88 mg/dL (ref 65–99)

## 2015-04-08 MED ORDER — PROMETHAZINE HCL 25 MG PO TABS
25.0000 mg | ORAL_TABLET | Freq: Four times a day (QID) | ORAL | Status: DC | PRN
  Administered 2015-04-08 – 2015-04-09 (×2): 25 mg via ORAL
  Filled 2015-04-08 (×2): qty 1

## 2015-04-08 MED ORDER — SODIUM CHLORIDE 0.9 % IV SOLN
INTRAVENOUS | Status: AC
  Administered 2015-04-08: 17:00:00 via INTRAVENOUS

## 2015-04-08 MED ORDER — AMOXICILLIN-POT CLAVULANATE 875-125 MG PO TABS
1.0000 | ORAL_TABLET | Freq: Two times a day (BID) | ORAL | Status: DC
  Administered 2015-04-08 – 2015-04-09 (×2): 1 via ORAL
  Filled 2015-04-08 (×3): qty 1

## 2015-04-08 NOTE — Progress Notes (Signed)
[facility] Progress Note  Subjective:  No pain this AM.  Tolerating clears, but unwilling to advance diet to full liquids today.  Objective:  Vital signs in last 24 hours: Temp:  [97.6 F (36.4 C)-97.8 F (36.6 C)] 97.8 F (36.6 C) (07/22 0600) Pulse Rate:  [59-64] 64 (07/22 0600) Resp:  [16] 16 (07/22 0600) BP: (101-128)/(54-61) 101/54 mmHg (07/22 0600) SpO2:  [100 %] 100 % (07/22 0600) Weight:  [161 lb 6.4 oz (73.211 kg)] 161 lb 6.4 oz (73.211 kg) (07/22 0654) Last BM Date: 04/05/15 General:  Alert, Well-developed, in NAD Heart:  Regular rate and rhythm; no murmurs Pulm:  CTAB.  No W/R/R. Abdomen:  Soft, non-distended. Normal bowel sounds.  Non-tender. Extremities:  Without edema. Neurologic:  Alert and  oriented x4;  grossly normal neurologically. Psych:  Alert and cooperative. Normal mood and affect.  Intake/Output from previous day: 07/21 0701 - 07/22 0700 In: 1842.5 [P.O.:120; I.V.:1422.5; IV Piggyback:300] Out: 2450 [Urine:2450]  Lab Results:  Recent Labs  04/05/15 1323 04/06/15 1210 04/07/15 0435  WBC 13.0* 9.9 10.5  HGB 15.3* 13.8 12.4  HCT 45.5 42.1 38.4  PLT 489.0* 357 354   BMET  Recent Labs  04/06/15 1210 04/07/15 0435  NA 140 139  K 3.7 3.4*  CL 103 104  CO2 26 28  GLUCOSE 128* 116*  BUN <5* 6  CREATININE 0.77 0.81  CALCIUM 9.3 9.1   LFT  Recent Labs  04/07/15 0435  PROT 6.6  ALBUMIN 3.7  AST 19  ALT 16  ALKPHOS 93  BILITOT 0.7   PT/INR  Recent Labs  04/06/15 1210  LABPROT 14.5  INR 1.12   Ct Abdomen Pelvis W Contrast  04/06/2015   CLINICAL DATA:  LEFT lower quadrant pain with nausea, leukocytosis, history diverticulitis  EXAM: CT ABDOMEN AND PELVIS WITH CONTRAST  TECHNIQUE: Multidetector CT imaging of the abdomen and pelvis was performed using the standard protocol following bolus administration of intravenous contrast. Sagittal and coronal MPR images reconstructed from axial data set.  CONTRAST:   OMNIPAQUE IOHEXOL 300 MG/ML SOLN IV. Dilute oral contrast.  COMPARISON:  03/28/2015  FINDINGS: Lung bases clear.  Gallbladder surgically absent.  Minimal fatty infiltration of liver.  Small LEFT renal cysts.  Liver, spleen, pancreas, kidneys, and adrenal glands otherwise normal appearance.  Normal appendix.  Distal colonic diverticulosis with minimal sigmoid wall thickening.  Pericolic inflammatory changes seen previously have resolved.  Stomach and remaining bowel loops normal appearance.  Decompressed bladder, distal ureters, uterus and adnexa unremarkable.  No mass, adenopathy, free fluid, or free air.  Tiny umbilical hernia containing fat.  Osseous structures unremarkable.  IMPRESSION: Distal colonic diverticulosis with mild wall thickening at the sigmoid colon similar distribution to that seen on the previous exam likely representing residual changes from acute diverticulitis.  Resolution of pericolic inflammatory changes seen on previous exam.  Minimal fatty infiltration of liver.  Small LEFT renal cysts.  Tiny umbilical hernia containing fat.   Electronically Signed   By: Ulyses Southward M.D.   On: 04/06/2015 18:45    Assessment / Plan: *59 year old female with past history of IBS and diverticular disease, recently found to have diverticulitis on CT. Treated as outpatient with cipro and flagyl with minimal reported improvement. Repeat CT scan 7/20 showed radiographic improvement/near resolution, however. Labs unremarkable. Was admitted for IV antibiotics. Had apparent reaction to IV cipro so cipro and flagyl discontinued and she was placed on Zosyn.  Tolerating clears but unwilling to  advance diet further today despite no significant pain.  Anticipate discharge soon. May need to go home on Augmentin since she did not have subjective improvement with PO cipro and flagyl.  Likely functional component to her symptoms.   LOS: 2 days   ZEHR, JESSICA D.  04/08/2015, 9:24 AM  Pager number  [phone]  Attending MD note:   I have taken a history, examined the patient, and reviewed the chart. I agree with the Advanced Practitioner's impression and recommendations. I had a long discussion with the patient, she has lot of anxieties. I encouraged her to try the diet, and if tolerated, she may be discharged tomorrow. Augmentin 875 mg , 1 po dg,  Probiotic, Nexiem 40 mg qd. Follow up OV  3 weeks.The cholesterol is elevated. She should follow a low fat, low cholesterol diet. Repeat test in 6-12 months. Residue and soft diet.  [facility] Pager # [phone]

## 2015-04-08 NOTE — Progress Notes (Addendum)
Patient ID: Valerie Conway, female   DOB: [birth year], 59 y.o.   MRN: [id number] [facility] PROGRESS NOTE  Valerie Conway:[id number] DOB: [birth year] DOA: 04/06/2015 PCP: Lenora Boys, MD  Brief narrative:    59 year old female with past medical history of depression, irritable bowel syndrome. Patient was recently in [place] where she was treated for small bowel obstruction from 01/24/2015 through 01/26/2015. She was told to follow-up with GI office to make sure her symptoms are controlled. She has followed in [facility] and and the initial approach was to start with colonoscopy. Patient has had colonoscopy 03/23/2015 and it revealed single ulcers about 3-7 mm in size in the sigmoid colon and biopsies of the ulcer were taken but it was suspected that this was resolving diverticulitis. She also had severe diverticulosis in the left colon with partial colon obstruction on colonoscopy. Patient was started on ciprofloxacin and then she noted that her pain got worse in the left lower quadrant after this colonoscopy. She was then started on flagyl which she took for about 3 days with last dose prior to this admission. She presented to GI office 04/06/2015 for follow up and because of complaints of ongoing abdominal pain she was referred to [facility] for evaluation.  Anticipated discharge: Likely 04/09/2015 if tolerates regular diet.   Assessment/Plan:    Principal problem: Left lower quadrant abdominal pain / possible acute diverticulitis / leukocytosis - Patient with known history of diverticulosis. She has had recent colonoscopy that demonstrated severe diverticulosis in the left colon. - CT abdomen on this admission reveals residual acute diverticulitis. - She was started on IV Cipro and Flagyl on the admission but has developed questionable allergic reaction to Cipro so abx changed to Zosyn. - Since she tolerated liquid diet we will stop Zosyn today and change abx to Augmentin today  - Advance  diet to soft - Continue IV fluids for next 8 hours - Continue antiemetics as needed, tolerates Phenergan better than Zofran - White blood cell count within normal limits  Active Problems: Anxiety and depression - Continue Prozac and Ativan  Hypokalemia - Secondary to GI losses - Supplemented - Check BMP tomorrow morning   DVT prophylaxis:  - SCDs bilaterally   Code Status: Full.  Family Communication:  plan of care discussed with the patient Disposition Plan: Home 04/09/2015  IV access:  Peripheral IV  Procedures and diagnostic studies:    Ct Abdomen Pelvis W Contrast 04/06/2015  Distal colonic diverticulosis with mild wall thickening at the sigmoid colon similar distribution to that seen on the previous exam likely representing residual changes from acute diverticulitis.  Resolution of pericolic inflammatory changes seen on previous exam.  Minimal fatty infiltration of liver.  Small LEFT renal cysts.  Tiny umbilical hernia containing fat.      Medical Consultants:  Gastroenterology, Dr. Lina Sar  Other Consultants:  None   IAnti-Infectives:   Cirpo and flagyl 7/20 --> 7/20 Zosyn 04/06/2015 --> 04/08/2015 Augmentin 04/08/2015 -->    Nolawi Kanady, MD  [facility] Pager [phone]  Time spent in minutes: 25 minutes  If 7PM-7AM, please contact night-coverage [email or web address] Password [facility] 04/08/2015, 10:27 AM   LOS: 2 days    HPI/Subjective: No acute overnight events. Patient reports feeling nausea but no vomiting.  Objective: Filed Vitals:   04/07/15 0528 04/07/15 1425 04/08/15 0600 04/08/15 0654  BP: 121/63 128/61 101/54   Pulse: 62 59 64   Temp: 98.1 F (36.7 C) 97.6 F (36.4 C) 97.8 F (36.6  C)   TempSrc: Oral Oral Oral   Resp: Height:      Weight:    73.211 kg (161 lb 6.4 oz)  SpO2: 98% 100% 100%     Intake/Output Summary (Last 24 hours) at 04/08/15 1027 Last data filed at 04/08/15 0600  Gross per 24 hour  Intake 1542.5 ml   Output   2150 ml  Net -607.5 ml    Exam:   General:  Pt is alert, follows commands appropriately, not in acute distress  Cardiovascular: Regular rate and rhythm, S1/S2, no murmurs  Respiratory: Clear to auscultation bilaterally, no wheezing, no crackles, no rhonchi  Abdomen: Soft, non tender, non distended, bowel sounds present  Extremities: No edema, pulses DP and PT palpable bilaterally  Neuro: Grossly nonfocal  Data Reviewed: Basic Metabolic Panel:  Recent Labs Lab 04/06/15 1210 04/07/15 0435  NA 140 139  K 3.7 3.4*  CL 103 104  CO2 26 28  GLUCOSE 128* 116*  BUN <5* 6  CREATININE 0.77 0.81  CALCIUM 9.3 9.1  MG 2.4  --   PHOS 3.5  --    Liver Function Tests:  Recent Labs Lab 04/06/15 1210 04/07/15 0435  AST 20 19  ALT 18 16  ALKPHOS 110 93  BILITOT 0.6 0.7  PROT 7.5 6.6  ALBUMIN 4.4 3.7   No results for input(s): LIPASE, AMYLASE in the last 168 hours. No results for input(s): AMMONIA in the last 168 hours. CBC:  Recent Labs Lab 04/05/15 1323 04/06/15 1210 04/07/15 0435  WBC 13.0* 9.9 10.5  NEUTROABS 9.8* 6.7  --   HGB 15.3* 13.8 12.4  HCT 45.5 42.1 38.4  MCV 87.8 87.5 87.7  PLT 489.0* 357 354   Cardiac Enzymes: No results for input(s): CKTOTAL, CKMB, CKMBINDEX, TROPONINI in the last 168 hours. BNP: Invalid input(s): POCBNP CBG:  Recent Labs Lab 04/07/15 0758 04/08/15 0706  GLUCAP 114* 88    No results found for this or any previous visit (from the past 240 hour(s)).   Scheduled Meds: . ALIGN  4 mg Oral BID  . amoxicillin-clavulanate  1 tablet Oral Q12H  . cholecalciferol  3,000 Units Oral BID  . dicyclomine  10 mg Oral QODAY  . FLUoxetine  20 mg Oral Daily  . LORazepam  1 mg Oral BID  . pantoprazole (PROTONIX) IV  40 mg Intravenous Q12H  . zolpidem  5 mg Oral QHS   Continuous Infusions: . sodium chloride

## 2015-04-09 DIAGNOSIS — F4323 Adjustment disorder with mixed anxiety and depressed mood: Secondary | ICD-10-CM | POA: Insufficient documentation

## 2015-04-09 DIAGNOSIS — E876 Hypokalemia: Secondary | ICD-10-CM | POA: Insufficient documentation

## 2015-04-09 LAB — GLUCOSE, CAPILLARY: GLUCOSE-CAPILLARY: 108 mg/dL — AB (ref 65–99)

## 2015-04-09 MED ORDER — AMOXICILLIN-POT CLAVULANATE 875-125 MG PO TABS: 1.0000 | ORAL_TABLET | Freq: Two times a day (BID) | ORAL | Status: DC

## 2015-04-09 MED ORDER — PROMETHAZINE HCL 25 MG PO TABS: 25.0000 mg | ORAL_TABLET | Freq: Four times a day (QID) | ORAL | Status: AC | PRN

## 2015-04-09 NOTE — Discharge Instructions (Signed)
Promethazine tablets What is this medicine? PROMETHAZINE (proe METH a zeen) is an antihistamine. It is used to treat allergic reactions and to treat or prevent nausea and vomiting from illness or motion sickness. It is also used to make you sleep before surgery, and to help treat pain or nausea after surgery. This medicine may be used for other purposes; ask your health care provider or pharmacist if you have questions. COMMON BRAND NAME(S): Phenergan What should I tell my health care provider before I take this medicine? They need to know if you have any of these conditions: -glaucoma -high blood pressure or heart disease -kidney disease -liver disease -lung or breathing disease, like asthma -prostate trouble -pain or difficulty passing urine -seizures -an unusual or allergic reaction to promethazine or phenothiazines, other medicines, foods, dyes, or preservatives -pregnant or trying to get pregnant -breast-feeding How should I use this medicine? Take this medicine by mouth with a glass of water. Follow the directions on the prescription label. Take your doses at regular intervals. Do not take your medicine more often than directed. Talk to your pediatrician regarding the use of this medicine in children. Special care may be needed. This medicine should not be given to infants and children younger than 51 years old. Overdosage: If you think you have taken too much of this medicine contact a poison control center or emergency room at once. NOTE: This medicine is only for you. Do not share this medicine with others. What if I miss a dose? If you miss a dose, take it as soon as you can. If it is almost time for your next dose, take only that dose. Do not take double or extra doses. What may interact with this medicine? Do not take this medicine with any of the following medications: -cisapride -dofetilide -dronedarone -MAOIs like Carbex, Eldepryl, Marplan, Nardil,  Parnate -pimozide -quinidine, including dextromethorphan; quinidine -thioridazine -ziprasidone This medicine may also interact with the following medications: -certain medicines for depression, anxiety, or psychotic disturbances -certain medicines for anxiety or sleep -certain medicines for seizures like carbamazepine, phenobarbital, phenytoin -certain medicines for movement abnormalities as in Parkinson's disease, or for gastrointestinal problems -epinephrine -medicines for allergies or colds -muscle relaxants -narcotic medicines for pain -other medicines that prolong the QT interval (cause an abnormal heart rhythm) -tramadol -trimethobenzamide This list may not describe all possible interactions. Give your health care provider a list of all the medicines, herbs, non-prescription drugs, or dietary supplements you use. Also tell them if you smoke, drink alcohol, or use illegal drugs. Some items may interact with your medicine. What should I watch for while using this medicine? Tell your doctor or health care professional if your symptoms do not start to get better in 1 to 2 days. You may get drowsy or dizzy. Do not drive, use machinery, or do anything that needs mental alertness until you know how this medicine affects you. To reduce the risk of dizzy or fainting spells, do not stand or sit up quickly, especially if you are an older patient. Alcohol may increase dizziness and drowsiness. Avoid alcoholic drinks. Your mouth may get dry. Chewing sugarless gum or sucking hard candy, and drinking plenty of water may help. Contact your doctor if the problem does not go away or is severe. This medicine may cause dry eyes and blurred vision. If you wear contact lenses you may feel some discomfort. Lubricating drops may help. See your eye doctor if the problem does not go away or is severe. This  medicine can make you more sensitive to the sun. Keep out of the sun. If you cannot avoid being in the sun,  wear protective clothing and use sunscreen. Do not use sun lamps or tanning beds/booths. If you are diabetic, check your blood-sugar levels regularly. What side effects may I notice from receiving this medicine? Side effects that you should report to your doctor or health care professional as soon as possible: -blurred vision -irregular heartbeat, palpitations or chest pain -muscle or facial twitches -pain or difficulty passing urine -seizures -skin rash -slowed or shallow breathing -unusual bleeding or bruising -yellowing of the eyes or skin Side effects that usually do not require medical attention (report to your doctor or health care professional if they continue or are bothersome): -headache -nightmares, agitation, nervousness, excitability, not able to sleep (these are more likely in children) -stuffy nose This list may not describe all possible side effects. Call your doctor for medical advice about side effects. You may report side effects to FDA at 1-800-FDA-1088. Where should I keep my medicine? Keep out of the reach of children. Store at room temperature, between 20 and 25 degrees C (68 and 77 degrees F). Protect from light. Throw away any unused medicine after the expiration date. NOTE: This sheet is a summary. It may not cover all possible information. If you have questions about this medicine, talk to your doctor, pharmacist, or health care provider.  2015, Elsevier/Gold Standard. (2013-05-05 15:04:46) Amoxicillin; Clavulanic Acid tablets What is this medicine? AMOXICILLIN; CLAVULANIC ACID (a mox i SIL in; KLAV yoo lan ic AS id) is a penicillin antibiotic. It is used to treat certain kinds of bacterial infections. It will not work for colds, flu, or other viral infections. This medicine may be used for other purposes; ask your health care provider or pharmacist if you have questions. COMMON BRAND NAME(S): Augmentin What should I tell my health care provider before I take  this medicine? They need to know if you have any of these conditions: -bowel disease, like colitis -kidney disease -liver disease -mononucleosis -an unusual or allergic reaction to amoxicillin, penicillin, cephalosporin, other antibiotics, clavulanic acid, other medicines, foods, dyes, or preservatives -pregnant or trying to get pregnant -breast-feeding How should I use this medicine? Take this medicine by mouth with a full glass of water. Follow the directions on the prescription label. Take at the start of a meal. Do not crush or chew. If the tablet has a score line, you may cut it in half at the score line for easier swallowing. Take your medicine at regular intervals. Do not take your medicine more often than directed. Take all of your medicine as directed even if you think you are better. Do not skip doses or stop your medicine early. Talk to your pediatrician regarding the use of this medicine in children. Special care may be needed. Overdosage: If you think you have taken too much of this medicine contact a poison control center or emergency room at once. NOTE: This medicine is only for you. Do not share this medicine with others. What if I miss a dose? If you miss a dose, take it as soon as you can. If it is almost time for your next dose, take only that dose. Do not take double or extra doses. What may interact with this medicine? -allopurinol -anticoagulants -birth control pills -methotrexate -probenecid This list may not describe all possible interactions. Give your health care provider a list of all the medicines, herbs, non-prescription drugs, or  dietary supplements you use. Also tell them if you smoke, drink alcohol, or use illegal drugs. Some items may interact with your medicine. What should I watch for while using this medicine? Tell your doctor or health care professional if your symptoms do not improve. Do not treat diarrhea with over the counter products. Contact your  doctor if you have diarrhea that lasts more than 2 days or if it is severe and watery. If you have diabetes, you may get a false-positive result for sugar in your urine. Check with your doctor or health care professional. Birth control pills may not work properly while you are taking this medicine. Talk to your doctor about using an extra method of birth control. What side effects may I notice from receiving this medicine? Side effects that you should report to your doctor or health care professional as soon as possible: -allergic reactions like skin rash, itching or hives, swelling of the face, lips, or tongue -breathing problems -dark urine -fever or chills, sore throat -redness, blistering, peeling or loosening of the skin, including inside the mouth -seizures -trouble passing urine or change in the amount of urine -unusual bleeding, bruising -unusually weak or tired -white patches or sores in the mouth or throat Side effects that usually do not require medical attention (report to your doctor or health care professional if they continue or are bothersome): -diarrhea -dizziness -headache -nausea, vomiting -stomach upset -vaginal or anal irritation This list may not describe all possible side effects. Call your doctor for medical advice about side effects. You may report side effects to FDA at 1-800-FDA-1088. Where should I keep my medicine? Keep out of the reach of children. Store at room temperature below 25 degrees C (77 degrees F). Keep container tightly closed. Throw away any unused medicine after the expiration date. NOTE: This sheet is a summary. It may not cover all possible information. If you have questions about this medicine, talk to your doctor, pharmacist, or health care provider.  2015, Elsevier/Gold Standard. (2007-11-27 12:04:30)

## 2015-04-09 NOTE — Progress Notes (Signed)
Doing well this morning, ate dinner last night, no abdominal pain, abdominal exam unremarkable. I encouraged her to eat breakfast. Recommend to discharge today on Augmentin 875 mg po qd x 1 week, Phenergan 12.5 mg po q 4-6 hrs prn nausea,( at night), Zofran 4 mg, 1 po q 6-8 hrs prn nausea ( day time), PPI, ?pain meds. Will see her in follow up in 2 weeks. (PA or myself)

## 2015-04-09 NOTE — Progress Notes (Signed)
Assessment unchanged. Denied pain. Verbalized understanding of dc instructions through teach back including medications to resume and follow up care. Family at bedside also verbalized understanding of post discharge care and follow up. Script x 2 given as provided by MD. Discharged via wc to front entrance to meet awaiting vehicle to carry home. Accompanied by husband and daughter and NT.

## 2015-04-09 NOTE — Discharge Summary (Signed)
Physician Discharge Summary  Valerie Conway:[id number] DOB: 15-Mar-1956 DOA: 04/06/2015  PCP: Lenora Boys, MD  Admit date: 04/06/2015 Discharge date: 04/09/2015  Recommendations for Outpatient Follow-up:  1. Please continue Augmentin as prescribed. 2. Follow up with GI per scheduled appt   Discharge Diagnoses:  Active Problems:   Diverticulitis   Abdominal pain, LLQ    Discharge Condition: stable   Diet recommendation: as tolerated   History of present illness:  59 year old female with past medical history of depression, irritable bowel syndrome. Patient was recently in [place] where she was treated for small bowel obstruction from 01/24/2015 through 01/26/2015. She was told to follow-up with GI office to make sure her symptoms are controlled. She has followed in [place] and and the initial approach was to start with colonoscopy. Patient has had colonoscopy 03/23/2015 and it revealed single ulcer about 3-7 mm in size in the sigmoid colon and biopsies of the ulcer were taken but it was suspected that this was resolving diverticulitis. She also had severe diverticulosis in the left colon with partial colon obstruction on colonoscopy. Patient was started on ciprofloxacin and then she noted that her pain got worse in the left lower quadrant after this colonoscopy. She was then started on flagyl which she took for about 3 days with last dose prior to this admission. She presented to GI office 04/06/2015 for follow up and because of complaints of ongoing abdominal pain she was referred to [facility] for evaluation.  Hospital Course:    Assessment/Plan:    Principal problem: Left lower quadrant abdominal pain / possible acute diverticulitis / leukocytosis - Patient with known history of diverticulosis. She has had recent colonoscopy that demonstrated severe diverticulosis in the left colon. - CT abdomen on this admission reveals residual acute diverticulitis. - She was started on IV  Cipro and Flagyl on the admission but has developed questionable allergic reaction to Cipro so abx changed to Zosyn. - Pt tolerated regular diet. She will take Augmentin as prescribed per GI recommendations.  - White blood cell count within normal limits  Active Problems: Anxiety and depression - Continue Prozac and Ativan  Hypokalemia - Secondary to GI losses - Supplemented   DVT prophylaxis:  - SCDs bilaterally   Code Status: Full.  Family Communication: plan of care discussed with the patient   IV access:  Peripheral IV  Procedures and diagnostic studies:   Ct Abdomen Pelvis W Contrast 04/06/2015 Distal colonic diverticulosis with mild wall thickening at the sigmoid colon similar distribution to that seen on the previous exam likely representing residual changes from acute diverticulitis. Resolution of pericolic inflammatory changes seen on previous exam. Minimal fatty infiltration of liver. Small LEFT renal cysts. Tiny umbilical hernia containing fat.    Medical Consultants:  Gastroenterology, Dr. Lina Sar  Other Consultants:  None   IAnti-Infectives:   Cirpo and flagyl 7/20 --> 7/20 Zosyn 04/06/2015 --> 04/08/2015 Augmentin 04/08/2015 -->     Signed:  Manson Passey, MD  [facility] 04/09/2015, 10:27 AM  Pager #: [phone]  Time spent in minutes: more than 30 minutes   Discharge Exam: Filed Vitals:   04/09/15 0618  BP: 125/72  Pulse: 58  Temp: 97.8 F (36.6 C)  Resp: 16   Filed Vitals:   04/08/15 1400 04/08/15 1800 04/08/15 2125 04/09/15 0618  BP: 112/60 117/67 145/68 125/72  Pulse: 71 62 72 58  Temp: 97.6 F (36.4 C) 98.2 F (36.8 C) 98.6 F (37 C) 97.8 F (36.6 C)  TempSrc: Oral Oral Oral Oral  Resp: 16 16 18 16   Height:      Weight:      SpO2: 99% 99% 99% 97%    General: Pt is alert, follows commands appropriately, not in acute distress Cardiovascular: Regular rate and rhythm, S1/S2 +, no  murmurs Respiratory: Clear to auscultation bilaterally, no wheezing, no crackles, no rhonchi Abdominal: Soft, non tender, non distended, bowel sounds +, no guarding Extremities: no edema, no cyanosis, pulses palpable bilaterally DP and PT Neuro: Grossly nonfocal  Discharge Instructions  Discharge Instructions    Call MD for:  persistant nausea and vomiting    Complete by:  As directed      Call MD for:  severe uncontrolled pain    Complete by:  As directed      Diet - low sodium heart healthy    Complete by:  As directed      Increase activity slowly    Complete by:  As directed             Medication List    STOP taking these medications        ciprofloxacin 500 MG tablet  Commonly known as:  CIPRO     metroNIDAZOLE 500 MG tablet  Commonly known as:  FLAGYL     ondansetron 4 MG tablet  Commonly known as:  ZOFRAN      TAKE these medications        acetaminophen 500 MG tablet  Commonly known as:  TYLENOL  Take 1,000 mg by mouth daily as needed for mild pain, moderate pain or headache.     ALIGN 4 MG Caps  Take 4 mg by mouth 3 (three) times daily.     amoxicillin-clavulanate 875-125 MG per tablet  Commonly known as:  AUGMENTIN  Take 1 tablet by mouth every 12 (twelve) hours.     bismuth subsalicylate 262 MG chewable tablet  Commonly known as:  PEPTO BISMOL  Chew 524 mg by mouth 2 (two) times daily as needed for indigestion.     cholecalciferol 1000 UNITS tablet  Commonly known as:  VITAMIN D  Take 3,000 Units by mouth 2 (two) times daily.     dicyclomine 10 MG capsule  Commonly known as:  BENTYL  Take 10 mg by mouth every other day.     dicyclomine 20 MG tablet  Commonly known as:  BENTYL  Take 10 mg by mouth daily.     FLUoxetine 20 MG tablet  Commonly known as:  PROZAC  Take 20 mg by mouth daily.     LORazepam 1 MG tablet  Commonly known as:  ATIVAN  Take 0.5 mg by mouth 2 (two) times daily as needed for anxiety.     NEXIUM 40 MG capsule   Generic drug:  esomeprazole  Take 40 mg by mouth 2 (two) times daily.     OVER THE COUNTER MEDICATION  Take 1 scoop by mouth daily.     promethazine 25 MG tablet  Commonly known as:  PHENERGAN  Take 1 tablet (25 mg total) by mouth every 6 (six) hours as needed for nausea or vomiting.     ranitidine 300 MG capsule  Commonly known as:  ZANTAC  Take 300 mg by mouth 2 (two) times a week. PRN heartburn, indigestion     traMADol 50 MG tablet  Commonly known as:  ULTRAM  Take 50 mg by mouth as needed. Pt not used yet     zolpidem 10  MG tablet  Commonly known as:  AMBIEN  Take 10 mg by mouth.           Follow-up Information    Follow up with FRIED, ROBERT L, MD. Schedule an appointment as soon as possible for a visit in 1 week.   Specialty:  Family Medicine   Why:  Follow up appt after recent hospitalization   Contact information:   [address] [facility] [address] [place] [address] [phone]        The results of significant diagnostics from this hospitalization (including imaging, microbiology, ancillary and laboratory) are listed below for reference.    Significant Diagnostic Studies: Ct Abdomen Pelvis W Contrast  04/06/2015   CLINICAL DATA:  LEFT lower quadrant pain with nausea, leukocytosis, history diverticulitis  EXAM: CT ABDOMEN AND PELVIS WITH CONTRAST  TECHNIQUE: Multidetector CT imaging of the abdomen and pelvis was performed using the standard protocol following bolus administration of intravenous contrast. Sagittal and coronal MPR images reconstructed from axial data set.  CONTRAST:  OMNIPAQUE IOHEXOL 300 MG/ML SOLN IV. Dilute oral contrast.  COMPARISON:  03/28/2015  FINDINGS: Lung bases clear.  Gallbladder surgically absent.  Minimal fatty infiltration of liver.  Small LEFT renal cysts.  Liver, spleen, pancreas, kidneys, and adrenal glands otherwise normal appearance.  Normal appendix.  Distal colonic diverticulosis with minimal sigmoid wall thickening.  Pericolic  inflammatory changes seen previously have resolved.  Stomach and remaining bowel loops normal appearance.  Decompressed bladder, distal ureters, uterus and adnexa unremarkable.  No mass, adenopathy, free fluid, or free air.  Tiny umbilical hernia containing fat.  Osseous structures unremarkable.  IMPRESSION: Distal colonic diverticulosis with mild wall thickening at the sigmoid colon similar distribution to that seen on the previous exam likely representing residual changes from acute diverticulitis.  Resolution of pericolic inflammatory changes seen on previous exam.  Minimal fatty infiltration of liver.  Small LEFT renal cysts.  Tiny umbilical hernia containing fat.   Electronically Signed   By: Ulyses Southward M.D.   On: 04/06/2015 18:45   Ct Abdomen Pelvis W Contrast  03/28/2015   CLINICAL DATA:  Recent partial small bowel obstruction. Persistent abdominal pain and cramping with nausea and vomiting. Colonoscopy last week showed a partial colon obstruction and ulcer.  EXAM: CT ABDOMEN AND PELVIS WITH CONTRAST  TECHNIQUE: Multidetector CT imaging of the abdomen and pelvis was performed using the standard protocol following bolus administration of intravenous contrast.  CONTRAST:  OMNIPAQUE IOHEXOL 300 MG/ML  SOLN  COMPARISON:  04/22/2009.  FINDINGS: Lower chest: Lung bases show no acute findings. Heart size normal. No pericardial or pleural effusion.  Hepatobiliary: Liver is decreased in attenuation diffusely. Cholecystectomy. No biliary ductal dilatation.  Pancreas: Negative.  Spleen: Negative.  Adrenals/Urinary Tract: Adrenal glands and right kidney are unremarkable. Low-attenuation lesions in the left kidney measure up to 9 mm, too small to characterize but statistically, cysts are most likely. Ureters are decompressed. Bladder is unremarkable.  Stomach/Bowel: Stomach is unremarkable. Question a small 11 mm diverticulum off the distal duodenum (series 2, image 41). Small bowel, appendix and majority of the  colon are otherwise unremarkable. Mild haziness is seen adjacent to the distal descending colon, without extraluminal air or fluid. Remainder of the colon is unremarkable.  Vascular/Lymphatic: Trace atherosclerotic calcification of the arterial vasculature. No pathologically enlarged lymph nodes.  Reproductive: Uterus and ovaries are visualized.  Other: No free fluid.  Mesenteries and peritoneum are unremarkable.  Musculoskeletal: No worrisome lytic or sclerotic lesions.  IMPRESSION: 1. Suspect mild changes of diverticulitis involving the distal descending colon. 2. No evidence of bowel obstruction. 3. Hepatic steatosis.   Electronically Signed   By: Leanna Battles M.D.   On: 03/28/2015 14:49    Microbiology: No results found for this or any previous visit (from the past 240 hour(s)).   Labs: Basic Metabolic Panel:  Recent Labs Lab 04/06/15 1210 04/07/15 0435  NA 140 139  K 3.7 3.4*  CL 103 104  CO2 26 28  GLUCOSE 128* 116*  BUN <5* 6  CREATININE 0.77 0.81  CALCIUM 9.3 9.1  MG 2.4  --   PHOS 3.5  --    Liver Function Tests:  Recent Labs Lab 04/06/15 1210 04/07/15 0435  AST 20 19  ALT 18 16  ALKPHOS 110 93  BILITOT 0.6 0.7  PROT 7.5 6.6  ALBUMIN 4.4 3.7   No results for input(s): LIPASE, AMYLASE in the last 168 hours. No results for input(s): AMMONIA in the last 168 hours. CBC:  Recent Labs Lab 04/05/15 1323 04/06/15 1210 04/07/15 0435  WBC 13.0* 9.9 10.5  NEUTROABS 9.8* 6.7  --   HGB 15.3* 13.8 12.4  HCT 45.5 42.1 38.4  MCV 87.8 87.5 87.7  PLT 489.0* 357 354   Cardiac Enzymes: No results for input(s): CKTOTAL, CKMB, CKMBINDEX, TROPONINI in the last 168 hours. BNP: BNP (last 3 results) No results for input(s): BNP in the last 8760 hours.  ProBNP (last 3 results) No results for input(s): PROBNP in the last 8760 hours.  CBG:  Recent Labs Lab 04/07/15 0758 04/08/15 0706  GLUCAP 114* 88

## 2015-04-12 ENCOUNTER — Telehealth: Payer: Self-pay | Admitting: Internal Medicine

## 2015-04-12 ENCOUNTER — Other Ambulatory Visit: Payer: Self-pay

## 2015-04-12 MED ORDER — HYOSCYAMINE SULFATE 0.125 MG SL SUBL: 0.1250 mg | SUBLINGUAL_TABLET | SUBLINGUAL | Status: AC | PRN

## 2015-04-12 NOTE — Telephone Encounter (Signed)
Recently hospitalized with diverticulitis. She was discharged on Augmentin on 04/06/15. She has started feeling very poorly today. Her pain has increased and has spread from LLQ to central abdomen. Bowel movement yesterday was very loose. No BM today. She feels weak and shakey. Should I send her to the ER?

## 2015-04-12 NOTE — Telephone Encounter (Signed)
As directed by Dr Juanda Chance, the patient will continue the Augmentin, go back to liquid diet for colon rest, trial of Levsin as per medication sheet. Follow up by phone 24 to 48 hours. Call if she acutely worsens.

## 2015-04-13 NOTE — Telephone Encounter (Signed)
Spoke with the patient. She feeling better. No bm but she is passing gas. Her abdominal discomfort is not constant. She is advancing her diet which we discussed in detail about low residue. She is aware of how to contact the on call doctor for after hours.

## 2015-04-13 NOTE — Telephone Encounter (Signed)
Reviewed and agree.

## 2015-04-15 ENCOUNTER — Encounter: Payer: Self-pay | Admitting: Internal Medicine

## 2015-04-15 ENCOUNTER — Ambulatory Visit (INDEPENDENT_AMBULATORY_CARE_PROVIDER_SITE_OTHER): Payer: BLUE CROSS/BLUE SHIELD | Admitting: Internal Medicine

## 2015-04-15 ENCOUNTER — Other Ambulatory Visit (INDEPENDENT_AMBULATORY_CARE_PROVIDER_SITE_OTHER): Payer: BLUE CROSS/BLUE SHIELD

## 2015-04-15 VITALS — BP 110/80 | HR 84 | Ht 63.25 in

## 2015-04-15 DIAGNOSIS — R1032 Left lower quadrant pain: Secondary | ICD-10-CM | POA: Diagnosis not present

## 2015-04-15 DIAGNOSIS — K5732 Diverticulitis of large intestine without perforation or abscess without bleeding: Secondary | ICD-10-CM

## 2015-04-15 LAB — CBC WITH DIFFERENTIAL/PLATELET
BASOS PCT: 0.3 % (ref 0.0–3.0)
Basophils Absolute: 0 10*3/uL (ref 0.0–0.1)
EOS ABS: 0.2 10*3/uL (ref 0.0–0.7)
Eosinophils Relative: 1.7 % (ref 0.0–5.0)
HEMATOCRIT: 45.8 % (ref 36.0–46.0)
Hemoglobin: 15.3 g/dL — ABNORMAL HIGH (ref 12.0–15.0)
Lymphocytes Relative: 18.2 % (ref 12.0–46.0)
Lymphs Abs: 2.3 10*3/uL (ref 0.7–4.0)
MCHC: 33.5 g/dL (ref 30.0–36.0)
MCV: 88 fl (ref 78.0–100.0)
Monocytes Absolute: 0.7 10*3/uL (ref 0.1–1.0)
Monocytes Relative: 5.5 % (ref 3.0–12.0)
NEUTROS PCT: 74.3 % (ref 43.0–77.0)
Neutro Abs: 9.5 10*3/uL — ABNORMAL HIGH (ref 1.4–7.7)
Platelets: 476 10*3/uL — ABNORMAL HIGH (ref 150.0–400.0)
RBC: 5.2 Mil/uL — AB (ref 3.87–5.11)
RDW: 13.2 % (ref 11.5–15.5)
WBC: 12.8 10*3/uL — AB (ref 4.0–10.5)

## 2015-04-15 LAB — SEDIMENTATION RATE: Sed Rate: 37 mm/hr — ABNORMAL HIGH (ref 0–22)

## 2015-04-15 MED ORDER — ROBINUL-FORTE 2 MG PO TABS: 2.0000 mg | ORAL_TABLET | Freq: Two times a day (BID) | ORAL | Status: AC

## 2015-04-15 MED ORDER — FLAGYL 500 MG PO TABS: ORAL_TABLET | ORAL | Status: AC

## 2015-04-15 MED ORDER — NEXIUM 40 MG PO CPDR: 40.0000 mg | DELAYED_RELEASE_CAPSULE | Freq: Two times a day (BID) | ORAL | Status: AC

## 2015-04-15 MED ORDER — CIPRO 250 MG PO TABS: 250.0000 mg | ORAL_TABLET | Freq: Two times a day (BID) | ORAL | Status: DC

## 2015-04-15 NOTE — Progress Notes (Signed)
Valerie Conway 10/27/1955 [id number]  Note: This dictation was prepared with Dragon digital system. Any transcriptional errors that result from this procedure are unintentional.   History of Present Illness: This is a 59 year old white female 6 days  post hospitalization for diverticulitis  April 06, 2015 til 04/09/2015. She has completely Augmentin 875 mg daily and she has had a recurrence of left lower quadrant abdominal pain located anteriorly, no fever. Stools have been rather soft. She has remained on  clear liquids and full liquids. Any time she tries to advance the diet the pain increases. She takes tramadol. She had a colonoscopy on 03/23/2015 which revealed an ulceration 3-7 mm in size in the sigmoid colon  which showed a nonspecific inflammation. Likely due to resolving diverticulitis. She has been on several courses of antibiotics Cipro and Flagyl and most recently in Augmentin. CT scan of the abdomen during the admission revealed residual diverticulitis. She has lost an additional 4 pounds staying on clear liquids.    Past Medical History  Diagnosis Date  . Fibromyalgia   . Insomnia   . Cervical disc disease   . Anxiety and depression   . Recurrent vaginitis   . IBS (irritable bowel syndrome)   . Post-cholecystectomy syndrome   . Ileus   . Diverticulosis   . Hepatic artery stenosis     Past Surgical History  Procedure Laterality Date  . Cholecystectomy    . Mandible surgery      Allergies  Allergen Reactions  . Codeine     Other reaction(s): Other Esophageal spasm  . Metronidazole Nausea And Vomiting    Able to tolerate brand Flagyl  . Benefiber [Wheat Bran] Swelling  . Oxycodone-Acetaminophen Diarrhea    Able to tolerate brand Tylox    Family history and social history have been reviewed.  Review of Systems: Denies fever. Positive for weight loss. Positive for left lower quadrant abdominal pain  The remainder of the 10 point ROS is negative except as  outlined in the H&P  Physical Exam: General Appearance Well developed, in no distress Eyes  Non icteric  HEENT  Non traumatic, normocephalic  Mouth No lesion, tongue papillated, no cheilosis Neck Supple without adenopathy, thyroid not enlarged, no carotid bruits, no JVD Lungs Clear to auscultation bilaterally COR Normal S1, normal S2, regular rhythm, no murmur, quiet precordium Abdomen tender in left lower quadrant but no rebound or mass. Bowel sounds are normoactive. The right upper and lower quadrants are unremarkable Rectal not done Extremities  No pedal edema Skin No lesions Neurological Alert and oriented x 3 Psychological Normal mood and affect  Assessment and Plan:   59 year old white female with persistent left lower quadrant abdominal pain. She has been treated for diverticulitis because CT scan abnormalities suggestive of continued inflammation. We have discussed the possibility of sigmoid resection. I would like to obtain  barium enema prior to surgical referral. She would like to try another course of antibiotics before making a decision about possible surgery. We will start Flagyl 250 mg 3 times a day and Cipro 250 mg twice a day for 14 days. Low residue and liquid diet. Robinul Forte 2 mg twice a day and probiotics 3 times a day. ROV in 2 weeks. Today we will check her CBC and sedimentation rate    Lina Sar 04/15/2015

## 2015-04-15 NOTE — Telephone Encounter (Signed)
Patient states that she has talked to [facility] regarding this and is requesting a CB from one of the nurses.

## 2015-04-15 NOTE — Telephone Encounter (Signed)
Patient reports that she is having continued pain, she is not able to tolerate a diet and has only 2 days left of antibiotics,.  She is advised she needs to come in and be seen today at 2:45

## 2015-04-15 NOTE — Patient Instructions (Signed)
We have printed your prescriptions for you to take to the pharmacy.   Your physician has requested that you go to the basement for the following lab work before leaving today:CBC, Sed rate.  You follow up visit with Dr. Juanda Chance is on 04/29/15 at 3:45pm.

## 2015-04-20 ENCOUNTER — Telehealth: Payer: Self-pay | Admitting: *Deleted

## 2015-04-20 ENCOUNTER — Other Ambulatory Visit: Payer: Self-pay | Admitting: *Deleted

## 2015-04-20 ENCOUNTER — Ambulatory Visit: Payer: BLUE CROSS/BLUE SHIELD | Admitting: Internal Medicine

## 2015-04-20 ENCOUNTER — Telehealth: Payer: Self-pay | Admitting: Internal Medicine

## 2015-04-20 MED ORDER — CIPROFLOXACIN HCL 500 MG PO TABS: 500.0000 mg | ORAL_TABLET | Freq: Two times a day (BID) | ORAL | Status: DC

## 2015-04-20 NOTE — Telephone Encounter (Signed)
Pt called for lab results, please advise.

## 2015-04-20 NOTE — Telephone Encounter (Signed)
It has been released now, OK to increase Cipro to 500 mg po bid,  Send new prescription, #20, 1 po bid, continue Flagyl as such.

## 2015-04-20 NOTE — Telephone Encounter (Signed)
Patient would like to have her labs released to MyChart. MD has to do this. Patient aware. She is also asking if she needs to increase her Cipro to 500 mg instead of 250 mg. She states she is still having abdominal pain. Please, advise.

## 2015-04-20 NOTE — Telephone Encounter (Signed)
Patient notified of recommendations. Rx sent to pharmacy. 

## 2015-04-20 NOTE — Telephone Encounter (Signed)
Patient notified of results and recommendations.

## 2015-04-20 NOTE — Telephone Encounter (Signed)
Please call pt with mild elevation of WBC,normal Hgb and mild elevation of sed.rate suggestive of an ongoing inflammation. Please continue antibiotics as prescribed. If pain becomes worse, call us back. I will see her on 04/29/2015

## 2015-04-29 ENCOUNTER — Other Ambulatory Visit (INDEPENDENT_AMBULATORY_CARE_PROVIDER_SITE_OTHER): Payer: BLUE CROSS/BLUE SHIELD

## 2015-04-29 ENCOUNTER — Encounter: Payer: Self-pay | Admitting: Internal Medicine

## 2015-04-29 ENCOUNTER — Ambulatory Visit (INDEPENDENT_AMBULATORY_CARE_PROVIDER_SITE_OTHER): Payer: BLUE CROSS/BLUE SHIELD | Admitting: Internal Medicine

## 2015-04-29 ENCOUNTER — Other Ambulatory Visit: Payer: Self-pay | Admitting: Internal Medicine

## 2015-04-29 VITALS — BP 128/90 | HR 79 | Ht 63.0 in | Wt 155.0 lb

## 2015-04-29 DIAGNOSIS — K5732 Diverticulitis of large intestine without perforation or abscess without bleeding: Secondary | ICD-10-CM

## 2015-04-29 LAB — CBC WITH DIFFERENTIAL/PLATELET
Basophils Absolute: 0 K/uL (ref 0.0–0.1)
Basophils Relative: 0.3 % (ref 0.0–3.0)
Eosinophils Absolute: 0.3 K/uL (ref 0.0–0.7)
Eosinophils Relative: 2.5 % (ref 0.0–5.0)
HCT: 43.3 % (ref 36.0–46.0)
Hemoglobin: 14.5 g/dL (ref 12.0–15.0)
Lymphocytes Relative: 23.3 % (ref 12.0–46.0)
Lymphs Abs: 2.7 K/uL (ref 0.7–4.0)
MCHC: 33.5 g/dL (ref 30.0–36.0)
MCV: 88.1 fl (ref 78.0–100.0)
Monocytes Absolute: 0.9 K/uL (ref 0.1–1.0)
Monocytes Relative: 7.6 % (ref 3.0–12.0)
Neutro Abs: 7.7 K/uL (ref 1.4–7.7)
Neutrophils Relative %: 66.3 % (ref 43.0–77.0)
Platelets: 480 K/uL — ABNORMAL HIGH (ref 150.0–400.0)
RBC: 4.92 Mil/uL (ref 3.87–5.11)
RDW: 13.4 % (ref 11.5–15.5)
WBC: 11.6 K/uL — ABNORMAL HIGH (ref 4.0–10.5)

## 2015-04-29 LAB — SEDIMENTATION RATE: Sed Rate: 42 mm/h — ABNORMAL HIGH (ref 0–22)

## 2015-04-29 MED ORDER — TRAMADOL HCL 50 MG PO TABS: 50.0000 mg | ORAL_TABLET | ORAL | Status: DC | PRN

## 2015-04-29 MED ORDER — SULFAMETHOXAZOLE-TRIMETHOPRIM 800-160 MG PO TABS: 1.0000 | ORAL_TABLET | Freq: Two times a day (BID) | ORAL | Status: DC

## 2015-04-29 NOTE — Patient Instructions (Addendum)
Your physician has requested that you go to the basement for the following lab work before leaving today: CBC/diff, Sed Rate  Continue your probiotic.  We are going to hold off rx'ing antibotics until we get the lab results.   Slowly go from your low residue to a regular to high fiber diet.  Handout provided.   We have sent the following medications to your pharmacy for you to pick up at your convenience: Tramadol   I appreciate the opportunity to care for you. Dr Dewaine Oats- CC

## 2015-04-29 NOTE — Progress Notes (Signed)
Valerie Conway Aug 28, 1956 [id number]  Note: This dictation was prepared with Dragon digital system. Any transcriptional errors that result from this procedure are unintentional.   History of Present Illness: This is a 59 year old white female with the complicated diverticulitis, this is a follow-up visit. She was hospitalized from July 20 through July 23 with severe left lower quadrant abdominal pain. She has been sick since May 2016 treated with  several courses of  antibiotics which included the Cipro and Flagyl, Augmentin and again Cipro and Flagyl. Last CT scan of the abdomen showed residual diverticulitis. Colonoscopy in July 2016 showed moderately severe diverticulosis  and  3-7  millimeter ulcer in the sigmoid colon  consistent with resolving diverticulitis. Since last visit 2 weeks ago she's been much improved. She is now able to tolerate low residue diet, she is having soft bowel movements. There has been no fever. There is a mild gassiness and bloating and some abdominal pain for which she takes tramadol. She will be moving to  in next 4 weeks    Past Medical History  Diagnosis Date  . Fibromyalgia   . Insomnia   . Cervical disc disease   . Anxiety and depression   . Recurrent vaginitis   . IBS (irritable bowel syndrome)   . Post-cholecystectomy syndrome   . Ileus   . Diverticulosis   . Hepatic artery stenosis     Past Surgical History  Procedure Laterality Date  . Cholecystectomy    . Mandible surgery      Allergies  Allergen Reactions  . Codeine     Other reaction(s): Other Esophageal spasm  . Metronidazole Nausea And Vomiting    Able to tolerate brand Flagyl  . Benefiber [Wheat Bran] Swelling  . Oxycodone-Acetaminophen Diarrhea    Able to tolerate brand Tylox    Family history and social history have been reviewed.  Review of Systems: Denies fever. Positive for weight loss of 13 pounds. Denies rectal bleeding  The remainder of the 10 point  ROS is negative except as outlined in the H&P  Physical Exam: General Appearance Well developed, in no distress Eyes  Non icteric  HEENT  Non traumatic, normocephalic  Mouth No lesion, tongue papillated, no cheilosis Neck Supple without adenopathy, thyroid not enlarged, no carotid bruits, no JVD Lungs Clear to auscultation bilaterally COR Normal S1, normal S2, regular rhythm, no murmur, quiet precordium Abdomen minimal tenderness in left lower quadrant. Rest of the abdomen is unremarkable. She has normal bowel sounds Rectal not repeated Extremities  No pedal edema Skin No lesions Neurological Alert and oriented x 3 Psychological Normal mood and affect  Assessment and Plan:   59 year old white female with the resolving diverticulitis who has been treated with several courses of broad-spectrum antibiotics. On the last office visit blood cell count and sedimentation rate were  elevated. We will repeat CBC and sedimentation rate today to decide whether we need to refill her antibiotics. She will gradually increase fiber: in  her diet. Continue probiotics. If the pain returns I suggested that she calls our office and obtain  CBC.  I would consider an alternative antibiotic  such as doxycycline or Septra DS    Lina Sar 04/29/2015

## 2015-05-02 ENCOUNTER — Telehealth: Payer: Self-pay | Admitting: *Deleted

## 2015-05-02 NOTE — Telephone Encounter (Signed)
OV added.

## 2015-05-02 NOTE — Telephone Encounter (Signed)
-----   Message from Hart Carwin, MD sent at 04/29/2015  5:48 PM EDT ----- Regarding: OV Please add to my schedule for Aug 25, at 2.30 pm

## 2015-05-10 ENCOUNTER — Telehealth: Payer: Self-pay | Admitting: Internal Medicine

## 2015-05-10 MED ORDER — SULFAMETHOXAZOLE-TRIMETHOPRIM 800-160 MG PO TABS: 1.0000 | ORAL_TABLET | Freq: Two times a day (BID) | ORAL | Status: AC

## 2015-05-10 NOTE — Telephone Encounter (Signed)
Bactrim refill ok' d per Dr Juanda Chance. Medication refilled.

## 2015-05-10 NOTE — Telephone Encounter (Signed)
OK to refill Bactrim

## 2015-05-12 ENCOUNTER — Telehealth: Payer: Self-pay | Admitting: Internal Medicine

## 2015-05-12 ENCOUNTER — Ambulatory Visit: Payer: BLUE CROSS/BLUE SHIELD | Admitting: Internal Medicine

## 2015-05-12 NOTE — Telephone Encounter (Signed)
No charge. 

## 2015-05-24 ENCOUNTER — Ambulatory Visit: Payer: BLUE CROSS/BLUE SHIELD | Admitting: Internal Medicine

## 2015-11-11 ENCOUNTER — Encounter

## 2015-11-15 ENCOUNTER — Encounter

## 2015-11-28 ENCOUNTER — Ambulatory Visit: Payer: BLUE CROSS/BLUE SHIELD | Primary: Family Medicine

## 2015-12-05 ENCOUNTER — Ambulatory Visit: Payer: BLUE CROSS/BLUE SHIELD | Primary: Family Medicine

## 2015-12-07 ENCOUNTER — Encounter: Attending: Internal Medicine | Primary: Family Medicine

## 2015-12-13 ENCOUNTER — Encounter

## 2015-12-20 ENCOUNTER — Encounter: Attending: Internal Medicine | Primary: Family Medicine

## 2015-12-26 ENCOUNTER — Inpatient Hospital Stay: Payer: BLUE CROSS/BLUE SHIELD | Attending: Rheumatology | Primary: Family Medicine

## 2016-01-06 ENCOUNTER — Ambulatory Visit: Admit: 2016-01-06 | Payer: PRIVATE HEALTH INSURANCE | Attending: Internal Medicine | Primary: Family Medicine

## 2016-01-06 DIAGNOSIS — F5101 Primary insomnia: Secondary | ICD-10-CM

## 2016-01-06 MED ORDER — AMBIEN 10 MG TABLET
10 mg | ORAL_TABLET | Freq: Every evening | ORAL | 0 refills | Status: AC | PRN
Start: 2016-01-06 — End: ?

## 2016-01-06 NOTE — Progress Notes (Signed)
HPI:  Crystal Hughes is a 60 y.o. year old female who is a new patient and is here to establish care.  She was previous followed by Dr Annitta NeedsFreed.  The following sections were reviewed & updated as appropriate: PMH, PL, PSH, and SH.  Will request records.    [place]. Has granddaughter she is helping care for. She is married.   She has a history of depression and anxiety and was just seen by NP at [facility] and she was just changed to cymbalta but felt dizzy so stopped. She has appt with psychiatry in 3 weeks to follow up. Previously she has been on prozac but was causing GI side effects. She is taking ativan bid. She is trying to wean off of her ativan. No suicidal ideation. She has been under a lot of stress with move.  Insomnia: she states has been trazodone but did not tolerate. She states can not take generic because she feels SOB 1 x on generic ambien. Also does not feel generics are effective.   She has history of fibromyalgia and states if not sleeping well symptoms worsen.   Hx of abdominal pain and was diagnosed with gastritis. She was started on nexium and states when she has tried to wean off has had recurrence of her abdominal pain. She has IBS with constipation and diarrhea.   Current Outpatient Prescriptions   Medication Sig Dispense Refill   ??? esomeprazole (NEXIUM) 40 mg capsule Take  by mouth daily.     ??? LORazepam (ATIVAN) 1 mg tablet Take  by mouth every four (4) hours as needed for Anxiety (1 in the am and 2 qhs).     ??? zolpidem (AMBIEN) 10 mg tablet Take  by mouth nightly as needed for Sleep.     ??? MESALAMINE (LIALDA PO) Take  by mouth.     ??? dicyclomine (BENTYL) 20 mg tablet Take 20 mg by mouth every six (6) hours.     ??? ibuprofen (ADVIL) 200 mg tablet Take  by mouth.     ??? bismuth subsalicylate (PEPTO-BISMOL) 262 mg chew Take 2 Tabs by mouth every three (3) hours as needed.     ??? loperamide (IMMODIUM) 2 mg tablet Take 2 mg by mouth four (4) times  daily as needed for Diarrhea.     ??? psyllium (METAMUCIL) powd Take  by mouth.     ??? IBUPROFEN/PSEUDOEPHEDRINE HCL (ADVIL COLD AND SINUS PO) Take  by mouth.     ??? Lactobacillus Reuteri (BIOGAIA PROBIOTIC) Take 1 Each by mouth daily.     ??? cholecalciferol (VITAMIN D3) 1,000 unit tablet Take  by mouth daily.     ??? multivitamin (ONE A DAY) tablet Take 1 Tab by mouth daily.     ??? LACTOBACILLUS ACIDOPHILUS (PROBIOTIC PO) Take  by mouth.     ??? DULoxetine (CYMBALTA) 20 mg capsule Take 10 mg by mouth daily.       Allergies   Allergen Reactions   ??? Codeine Other (comments)     spasm   ??? Metronidazole Nausea Only     nausea     History reviewed. No pertinent past medical history.  History reviewed. No pertinent surgical history.  History reviewed. No pertinent family history.  Social History   Substance Use Topics   ??? Smoking status: Never Smoker   ??? Smokeless tobacco: Never Used   ??? Alcohol use 1.2 oz/week     2 Glasses  of wine per week             Review of Systems   Constitutional: Positive for malaise/fatigue. Negative for chills and fever.   HENT: Negative for congestion and sore throat.    Eyes: Negative for blurred vision.   Respiratory: Negative for cough and shortness of breath.    Cardiovascular: Negative for chest pain and leg swelling.   Gastrointestinal: Negative for blood in stool, nausea and vomiting.   Genitourinary: Negative for dysuria, frequency and urgency.   Musculoskeletal: Positive for myalgias (low back from fibromyalgia).   Neurological: Negative for dizziness, sensory change, focal weakness and headaches.   Psychiatric/Behavioral: The patient is nervous/anxious and has insomnia.          Physical Exam   Constitutional: She appears well-developed. No distress.   BP 134/86   Pulse 79   Temp 98.5 ??F (36.9 ??C)   Resp 16   Ht  (1.626 m)   Wt 172 lb (78 kg)   SpO2 98%   BMI 29.52 kg/m2   HENT:   Head: Normocephalic and atraumatic.   Right Ear: Tympanic membrane and ear canal normal.    Left Ear: Tympanic membrane and ear canal normal.   Nose: Nose normal.   Mouth/Throat: Oropharynx is clear and moist.   Eyes: Conjunctivae and EOM are normal. Pupils are equal, round, and reactive to light.   Neck: Normal range of motion. No thyromegaly present.   Cardiovascular: Normal rate, regular rhythm, normal heart sounds and intact distal pulses.    No murmur heard.  Pulmonary/Chest: Effort normal and breath sounds normal.   Abdominal: Soft. Bowel sounds are normal. She exhibits no mass. There is no tenderness.   Musculoskeletal: She exhibits no edema or tenderness.   Trigger point tenderness lower back and bilateral hip.    Lymphadenopathy:     She has no cervical adenopathy.   Neurological: She has normal strength. No cranial nerve deficit or sensory deficit.   Skin: No rash noted.   Psychiatric: She has a normal mood and affect.   Vitals reviewed.     Assessment & Plan:    ICD-10-CM ICD-9-CM    1. Primary insomnia   She has been tried on lower dose ambien but unable to sleep at 5 mg dose and is currently doing well on ambien 10 mg every day. She also states not able to take generic ambien due to side effects and not effective. Script given for [employer] branded 10 mg every day. Counseled side effects.  F51.01 307.42    2. Anxiety and depression stable  Management by psychiatry and she is currently changing her medications.  F41.9 300.00     F32.9 311    3. Fibromyalgia stable  Has been seeing Dr Darylene Price. Will request records.  M79.7 729.1    4. Irritable bowel syndrome with both constipation and diarrhea  Controlled on bentyl and nexium. Followed by GI at [facility] K58.2 564.1      Follow-up Disposition:  Return in about 3 months (around 04/06/2016) for follow up.   Advised her to call back or return to office if symptoms worsen/change/persist.  Discussed expected course/resolution/complications of diagnosis in detail with patient.    Medication risks/benefits/costs/interactions/alternatives discussed with  patient.  She was given an after visit summary which includes diagnoses, current medications, & vitals.  She expressed understanding with the diagnosis and plan.

## 2016-02-29 ENCOUNTER — Inpatient Hospital Stay
Admit: 2016-02-29 | Discharge: 2016-02-29 | Disposition: A | Discharge status: Home / Self Care | Payer: BLUE CROSS/BLUE SHIELD | Attending: Emergency Medicine

## 2016-02-29 ENCOUNTER — Emergency Department: Admit: 2016-02-29 | Payer: BLUE CROSS/BLUE SHIELD | Primary: Family Medicine

## 2016-02-29 ENCOUNTER — Emergency Department

## 2016-02-29 DIAGNOSIS — R1084 Generalized abdominal pain: Secondary | ICD-10-CM

## 2016-02-29 LAB — CBC WITH AUTOMATED DIFF
ABS. BASOPHILS: 0 10*3/uL (ref 0.0–0.1)
ABS. EOSINOPHILS: 0 10*3/uL (ref 0.0–0.4)
ABS. LYMPHOCYTES: 1.6 10*3/uL (ref 0.8–3.5)
ABS. MONOCYTES: 0.4 10*3/uL (ref 0.0–1.0)
ABS. NEUTROPHILS: 14.1 10*3/uL — ABNORMAL HIGH (ref 1.8–8.0)
BASOPHILS: 0 % (ref 0–1)
EOSINOPHILS: 0 % (ref 0–7)
HCT: 43.7 % (ref 35.0–47.0)
HGB: 14.9 g/dL (ref 11.5–16.0)
LYMPHOCYTES: 10 % — ABNORMAL LOW (ref 12–49)
MCH: 30 PG (ref 26.0–34.0)
MCHC: 34.1 g/dL (ref 30.0–36.5)
MCV: 88.1 FL (ref 80.0–99.0)
MONOCYTES: 2 % — ABNORMAL LOW (ref 5–13)
NEUTROPHILS: 88 % — ABNORMAL HIGH (ref 32–75)
PLATELET: 457 10*3/uL — ABNORMAL HIGH (ref 150–400)
RBC: 4.96 M/uL (ref 3.80–5.20)
RDW: 12.6 % (ref 11.5–14.5)
WBC: 16.1 10*3/uL — ABNORMAL HIGH (ref 3.6–11.0)

## 2016-02-29 LAB — METABOLIC PANEL, COMPREHENSIVE
A-G Ratio: 1 — ABNORMAL LOW (ref 1.1–2.2)
ALT (SGPT): 27 U/L (ref 12–78)
AST (SGOT): 14 U/L — ABNORMAL LOW (ref 15–37)
Albumin: 4.6 g/dL (ref 3.5–5.0)
Alk. phosphatase: 139 U/L — ABNORMAL HIGH (ref 45–117)
Anion gap: 9 mmol/L (ref 5–15)
BUN/Creatinine ratio: 12 (ref 12–20)
BUN: 11 MG/DL (ref 6–20)
Bilirubin, total: 0.4 MG/DL (ref 0.2–1.0)
CO2: 27 mmol/L (ref 21–32)
Calcium: 10.3 MG/DL — ABNORMAL HIGH (ref 8.5–10.1)
Chloride: 97 mmol/L (ref 97–108)
Creatinine: 0.89 MG/DL (ref 0.55–1.02)
GFR est AA: >60 mL/min/{1.73_m2} (ref >60)
GFR est non-AA: >60 mL/min/{1.73_m2} (ref >60)
Globulin: 4.7 g/dL — ABNORMAL HIGH (ref 2.0–4.0)
Glucose: 151 mg/dL — ABNORMAL HIGH (ref 65–100)
Potassium: 4.3 mmol/L (ref 3.5–5.1)
Protein, total: 9.3 g/dL — ABNORMAL HIGH (ref 6.4–8.2)
Sodium: 133 mmol/L — ABNORMAL LOW (ref 136–145)

## 2016-02-29 LAB — URINALYSIS W/MICROSCOPIC
Bacteria: NEGATIVE /hpf
Bilirubin: NEGATIVE
Blood: NEGATIVE
Glucose: NEGATIVE mg/dL
Leukocyte Esterase: NEGATIVE
Nitrites: NEGATIVE
Protein: NEGATIVE mg/dL
Specific gravity: 1.02 (ref 1.003–1.030)
Urobilinogen: 0.2 EU/dL (ref 0.2–1.0)
pH (UA): 5.5 (ref 5.0–8.0)

## 2016-02-29 LAB — LIPASE: Lipase: 103 U/L (ref 73–393)

## 2016-02-29 MED ORDER — SODIUM CHLORIDE 0.9 % IJ SYRG
Freq: Once | INTRAMUSCULAR | Status: AC
Start: 2016-02-29 — End: 2016-02-29
  Administered 2016-02-29: 21:00:00 via INTRAVENOUS

## 2016-02-29 MED ORDER — IOPAMIDOL 76 % IV SOLN
370 mg iodine /mL (76 %) | Freq: Once | INTRAVENOUS | Status: AC
Start: 2016-02-29 — End: 2016-02-29
  Administered 2016-02-29: 21:00:00 via INTRAVENOUS

## 2016-02-29 MED ORDER — IOHEXOL 240 MG/ML IV SOLN
240 mg iodine/mL | Freq: Once | INTRAVENOUS | Status: DC
Start: 2016-02-29 — End: 2016-02-29

## 2016-02-29 MED ORDER — SODIUM CHLORIDE 0.9% BOLUS IV
0.9 % | Freq: Once | INTRAVENOUS | Status: AC
Start: 2016-02-29 — End: 2016-02-29
  Administered 2016-02-29: 21:00:00 via INTRAVENOUS

## 2016-02-29 MED ORDER — TRAMADOL 50 MG TAB
50 mg | ORAL_TABLET | Freq: Three times a day (TID) | ORAL | 0 refills | Status: AC | PRN
Start: 2016-02-29 — End: ?

## 2016-02-29 MED FILL — SODIUM CHLORIDE 0.9 % IV: INTRAVENOUS | Qty: 100

## 2016-02-29 MED FILL — OMNIPAQUE 240 MG IODINE/ML INTRAVENOUS SOLUTION: 240 mg iodine/mL | INTRAVENOUS | Qty: 50

## 2016-02-29 MED FILL — ISOVUE-370  76 % INTRAVENOUS SOLUTION: 370 mg iodine /mL (76 %) | INTRAVENOUS | Qty: 100

## 2016-02-29 MED FILL — BD POSIFLUSH NORMAL SALINE 0.9 % INJECTION SYRINGE: INTRAMUSCULAR | Qty: 100

## 2016-02-29 NOTE — Consults (Signed)
**Note Crystal-Identified via Obfuscation** Consults  by Wilfred Curtis, MD at 02/29/16 1653                Author: Wilfred Curtis, MD  Service: Gastroenterology  Author Type: Physician       Filed: 02/29/16 1713  Date of Service: 02/29/16 1653  Status: Addendum          Editor: Wilfred Curtis, MD (Physician)          Related Notes: Original Note by Kalman Shan., NP (Acute Care Nurse Practitioner) filed  at 02/29/16 1707            Consult Orders        1. IP CONSULT TO GASTROENTEROLOGY [[id number]] ordered by Evelena Leyden, MD at 02/29/16 1511                                 [place]     [facility]    [address]   [place] [address]           GASTROENTEROLOGY CONSULTATION NOTE   Zola Button, ACNP   [phone] office   [phone] NP in-hospital cell phone M-F until 4:30   After 5pm or on weekends, please call operator for physician on call             NAME:  Crystal Hughes     DOB:   1956-07-05    MRN:   [id number]           Referring Physician: Arbutus Ped      Consult Date: 02/29/2016  4:53 PM      Chief Complaint: abdominal pain       History of Present Illness:  Patient is a  59 y.o. who is seen in consultation at the request of Dr. Mayford Knife for colitis. Pt PMH as listed.      Pt presents to ER with abdominal pain. Pt was diagnosed with diverticulitis about 13 months ago and has been worked up extensively since then for ongoing LLQ pain which radiates to RUQ. Pt has been on Cipro/flagyl a few times. First bout was about 2011  - she describes some passage of a pellet wrapped in pink and states it was "pretty obvious" what it was and that it was probably a passed diverticula. Before this one, it was about 2015 and she self-treated with her Rx for Cipro/flagyl . Was evaluated at [facility] who recommended mesalamines for possible inflammatory component. Pt thinks these are working. Also is on Prednisone and just started 2nd dose pack from internal medicine  and she states that physician can only order the  type/taper that is "in his purview": 60 mg start, quick taper. Before she was on 30 mg slow taper. [facility] providers  would not prescribe mesalamine for diverticulitis. She has been seen at [facility] but states [place] is not accessible enough. She had an xray yesterday of her abdomen which showed a white "blob" but they couldn't tell where it was.       I have reviewed the emergency room note and notes by all other clinicians who have seen the patient during this hospitalization to date. I have reviewed the problem list and the reason for this hospitalization.  I have reviewed the allergies and the medications the patient was taking at home prior to this hospitalization.  PMH:     Past Medical History:        Diagnosis  Date         ?  Ill-defined condition            Fibromyalgia           PSH:     Past Surgical History:         Procedure  Laterality  Date          ?  HX CHOLECYSTECTOMY               Allergies:     Allergies        Allergen  Reactions         ?  Codeine  Other (comments)             spasm         ?  Metronidazole  Nausea Only             nausea           Home Medications:     Prior to Admission Medications     Prescriptions  Last Dose  Informant  Patient Reported?  Taking?      AMBIEN 10 mg tablet      No  Yes      Sig: Take 1 Tab by mouth nightly as needed for Sleep. Max Daily Amount: 10 mg. Medically necessary for branded.      Bifidobacterium Infantis (ALIGN) 4 mg cap      Yes  Yes      Sig: Take 1 Cap by mouth two (2) times a day.      LORazepam (ATIVAN) 1 mg tablet      Yes  Yes      Sig: Take 1 mg by mouth every four (4) hours as needed for Anxiety.      Lactobac2-Bifido1-Strep therm. (VSL#3) 112.5 billion cell cap      Yes  Yes      Sig: Take 1 Cap by mouth two (2) times a day.      acetaminophen (TYLENOL ARTHRITIS PAIN) 650 mg CR tablet      Yes  Yes      Sig: Take 650 mg by mouth three (3) times daily.       amoxicillin-clavulanate (AUGMENTIN) 875-125 mg per tablet      Yes  Yes      Sig: Take 1 Tab by mouth two (2) times a day.      bismuth subsalicylate (PEPTO-BISMOL) 262 mg chew      Yes  Yes      Sig: Take 2 Tabs by mouth every three (3) hours as needed.      cholecalciferol (VITAMIN D3) 1,000 unit tablet      Yes  Yes      Sig: Take 3,000 Units by mouth two (2) times a day.      dicyclomine (BENTYL) 20 mg tablet      Yes  Yes      Sig: Take 20 mg by mouth every eight (8) hours.      esomeprazole (NEXIUM) 40 mg capsule      Yes  Yes      Sig: Take  by mouth daily.      loperamide (IMMODIUM) 2 mg tablet      Yes  Yes      Sig: Take 2 mg by mouth four (4) times daily as needed for Diarrhea.  mesalamine (LIALDA) 1.2 gram delayed release tablet      Yes  Yes      Sig: Take 2.4 g by mouth Daily (before breakfast).      metroNIDAZOLE (FLAGYL) 500 mg tablet      Yes  Yes      Sig: Take 500 mg by mouth three (3) times daily.      multivitamin (ONE A DAY) tablet      Yes  Yes      Sig: Take 1 Tab by mouth daily.               Facility-Administered Medications: None           Hospital Medications:     Current Facility-Administered Medications          Medication  Dose  Route  Frequency           ?  sodium chloride 0.9 % bolus infusion 100 mL   100 mL  IntraVENous  RAD ONCE     ?  iohexol (OMNIPAQUE) solution 50 mL   50 mL  Oral  RAD ONCE     ?  iopamidol (ISOVUE-370) 76 % injection 100 mL   100 mL  IntraVENous  RAD ONCE           ?  sodium chloride (NS) flush 10 mL   10 mL  IntraVENous  RAD ONCE          Current Outpatient Prescriptions        Medication  Sig         ?  mesalamine (LIALDA) 1.2 gram delayed release tablet  Take 2.4 g by mouth Daily (before breakfast).     ?  Lactobac2-Bifido1-Strep therm. (VSL#3) 112.5 billion cell cap  Take 1 Cap by mouth two (2) times a day.     ?  Bifidobacterium Infantis (ALIGN) 4 mg cap  Take 1 Cap by mouth two (2) times a day.     ?  acetaminophen (TYLENOL ARTHRITIS PAIN) 650 mg  CR tablet  Take 650 mg by mouth three (3) times daily.     ?  metroNIDAZOLE (FLAGYL) 500 mg tablet  Take 500 mg by mouth three (3) times daily.     ?  amoxicillin-clavulanate (AUGMENTIN) 875-125 mg per tablet  Take 1 Tab by mouth two (2) times a day.     ?  esomeprazole (NEXIUM) 40 mg capsule  Take  by mouth daily.     ?  LORazepam (ATIVAN) 1 mg tablet  Take 1 mg by mouth every four (4) hours as needed for Anxiety.     ?  dicyclomine (BENTYL) 20 mg tablet  Take 20 mg by mouth every eight (8) hours.     ?  bismuth subsalicylate (PEPTO-BISMOL) 262 mg chew  Take 2 Tabs by mouth every three (3) hours as needed.     ?  loperamide (IMMODIUM) 2 mg tablet  Take 2 mg by mouth four (4) times daily as needed for Diarrhea.     ?  cholecalciferol (VITAMIN D3) 1,000 unit tablet  Take 3,000 Units by mouth two (2) times a day.     ?  multivitamin (ONE A DAY) tablet  Take 1 Tab by mouth daily.         ?  AMBIEN 10 mg tablet  Take 1 Tab by mouth nightly as needed for Sleep. Max Daily Amount: 10 mg. Medically necessary for branded.  Social History:     Social History       Substance Use Topics         ?  Smoking status:  Never Smoker     ?  Smokeless tobacco:  Never Used         ?  Alcohol use  1.2 oz/week             2 Glasses of wine per week           Family History:   History reviewed. No pertinent family history.      Review of Systems:   Constitutional: negative fever, + chills, negative weight loss   Eyes:   negative visual changes   ENT:   negative sore throat, tongue or lip swelling   Respiratory:  negative cough, negative dyspnea   Cards:  negative for chest pain, palpitations, lower extremity edema   GI:   See HPI   GU:  negative for frequency, dysuria   Integument:  negative for rash and pruritus   Heme:  negative for easy bruising and gum/nose bleeding   Musculoskel: negative for myalgias, back pain and muscle weakness   Neuro: negative for headaches, dizziness, vertigo   Psych:  negative for feelings of anxiety,  depression         Objective:     Patient Vitals for the past 8 hrs:              BP  Temp  Pulse  Resp  SpO2  Height  Weight     02/29/16 1523  141/88  98.1 ??F (36.7 ??C)  74  14  98 %  -  -     02/29/16 1312  156/88  98 ??F (36.7 ??C)  83  20  97 %   (1.626 m)  76.8 kg (169 lb 6.4 oz)                   EXAM:      CONST:  Pleasant woman in bed in no acute distress. Sitting on edge of bed    NEURO:  alert and oriented x 3    HEENT: EOMI, no scleral icterus    LUNGS: clear to ausculation, (-) wheeze    CARD:  regular rate and rhythm, S1 S2    ABD:  soft, minor tenderness LLQ, no rebound, bowel sounds (+) all 4 quadrants, no masses, non distended    EXT:  no edema, warm    PSYCH: full, not anxious       Data Review         Recent Labs            02/29/16    1345     WBC   16.1*     HGB   14.9     HCT   43.7        PLT   457*          Recent Labs            02/29/16    1345     NA   133*     K   4.3     CL   97     CO2   27     BUN   11     CREA   0.89     GLU   151*        CA   10.3*  Recent Labs            02/29/16    1345     SGOT   14*     AP   139*     TP   9.3*     ALB   4.6     GLOB   4.7*        LPSE   103        No results for input(s): INR, PTP, APTT in the last 72 hours.      No lab exists for component: INREXT            Assessment:     ??    LLQ abdominal pain: complex case with extensive work up. Sounds more like diverticulitis vs inflammatory disease. Plus no IBD on  colonoscopy. Surgery may be potential endpoint.     There is no problem list on file for this patient.        Plan:     ??    Await results of CT scan   ??  No med changes at this time   ??  If CT with no acute findings, d/c home and keep appt with Dr. Nolon BussingSrivastava   ??  Thank you for allowing me to participate in care of Crystal Hughes           Signed By:  Margaretmary DysAshley L. Malen GauzeFoster, NP        02/29/2016  4:53 PM           I have examined the patient.  I have reviewed the chart and agree with the documentation recorded by the NP, including the  assessment, treatment plan, and disposition.         Discussed the above plan with pt and Dr. Carley HammedWilliams      Mattelyn Imhoff Abou-Assi, MD

## 2016-02-29 NOTE — ED Triage Notes (Signed)
Pt arrives from home c/o chronic LLQ abd pain.  Pt reports she has been to several doctors and they are unable to tell her what is wrong.  She is currently being treated for possible diverticulitis.  Pt states that she has an appt with a new GI doctor is 2 weeks.  Pt reports diarrhea yesterday.  Pt denies n/v.

## 2016-02-29 NOTE — Consults (Addendum)
**Note Crystal-Identified via Obfuscation** [place]    [facility]   [address]  [place] [address]        GASTROENTEROLOGY CONSULTATION NOTE  Zola Button, ACNP  [phone] office  [phone] NP in-hospital cell phone M-F until 4:30  After 5pm or on weekends, please call operator for physician on call        NAME:  Crystal Hughes   DOB:   1956/01/22   MRN:   [id number]       Referring Physician: Arbutus Ped    Consult Date: 02/29/2016 4:53 PM    Chief Complaint: abdominal pain     History of Present Illness:  Patient is a 60 y.o. who is seen in consultation at the request of Dr. Mayford Knife for colitis. Pt PMH as listed.    Pt presents to ER with abdominal pain. Pt was diagnosed with diverticulitis about 13 months ago and has been worked up extensively since then for ongoing LLQ pain which radiates to RUQ. Pt has been on Cipro/flagyl a few times. First bout was about 2011 - she describes some passage of a pellet wrapped in pink and states it was "pretty obvious" what it was and that it was probably a passed diverticula. Before this one, it was about 2015 and she self-treated with her Rx for Cipro/flagyl. Was evaluated at [facility] who recommended mesalamines for possible inflammatory component. Pt thinks these are working. Also is on Prednisone and just started 2nd dose pack from internal medicine and she states that physician can only order the type/taper that is "in his purview": 60 mg start, quick taper. Before she was on 30 mg slow taper. Pt states when [facility] was told community GI providers would not prescribe mesalamine for diverticulitis. She has been seen at [facility] but states [place] is not accessible enough. She had an xray yesterday of her abdomen which showed a white "blob" but they couldn't tell where it was.     I have reviewed the emergency room note and notes by all other clinicians who have seen the patient during this hospitalization to date. I have  reviewed the problem list and the reason for this hospitalization. I have reviewed the allergies and the medications the patient was taking at home prior to this hospitalization.    PMH:  Past Medical History:   Diagnosis Date   ??? Ill-defined condition     Fibromyalgia       PSH:  Past Surgical History:   Procedure Laterality Date   ??? HX CHOLECYSTECTOMY         Allergies:  Allergies   Allergen Reactions   ??? Codeine Other (comments)     spasm   ??? Metronidazole Nausea Only     nausea       Home Medications:  Prior to Admission Medications   Prescriptions Last Dose Informant Patient Reported? Taking?   AMBIEN 10 mg tablet   No Yes   Sig: Take 1 Tab by mouth nightly as needed for Sleep. Max Daily Amount: 10 mg. Medically necessary for branded.   Bifidobacterium Infantis (ALIGN) 4 mg cap   Yes Yes   Sig: Take 1 Cap by mouth two (2) times a day.   LORazepam (ATIVAN) 1 mg tablet   Yes Yes   Sig: Take 1 mg by mouth every four (4) hours as needed for Anxiety.   Lactobac2-Bifido1-Strep therm. (VSL#3) 112.5 billion cell cap   Yes Yes   Sig:  Take 1 Cap by mouth two (2) times a day.   acetaminophen (TYLENOL ARTHRITIS PAIN) 650 mg CR tablet   Yes Yes   Sig: Take 650 mg by mouth three (3) times daily.   amoxicillin-clavulanate (AUGMENTIN) 875-125 mg per tablet   Yes Yes   Sig: Take 1 Tab by mouth two (2) times a day.   bismuth subsalicylate (PEPTO-BISMOL) 262 mg chew   Yes Yes   Sig: Take 2 Tabs by mouth every three (3) hours as needed.   cholecalciferol (VITAMIN D3) 1,000 unit tablet   Yes Yes   Sig: Take 3,000 Units by mouth two (2) times a day.   dicyclomine (BENTYL) 20 mg tablet   Yes Yes   Sig: Take 20 mg by mouth every eight (8) hours.   esomeprazole (NEXIUM) 40 mg capsule   Yes Yes   Sig: Take  by mouth daily.   loperamide (IMMODIUM) 2 mg tablet   Yes Yes   Sig: Take 2 mg by mouth four (4) times daily as needed for Diarrhea.   mesalamine (LIALDA) 1.2 gram delayed release tablet   Yes Yes    Sig: Take 2.4 g by mouth Daily (before breakfast).   metroNIDAZOLE (FLAGYL) 500 mg tablet   Yes Yes   Sig: Take 500 mg by mouth three (3) times daily.   multivitamin (ONE A DAY) tablet   Yes Yes   Sig: Take 1 Tab by mouth daily.      Facility-Administered Medications: None       Hospital Medications:  Current Facility-Administered Medications   Medication Dose Route Frequency   ??? sodium chloride 0.9 % bolus infusion 100 mL  100 mL IntraVENous RAD ONCE   ??? iohexol (OMNIPAQUE) solution 50 mL  50 mL Oral RAD ONCE   ??? iopamidol (ISOVUE-370) 76 % injection 100 mL  100 mL IntraVENous RAD ONCE   ??? sodium chloride (NS) flush 10 mL  10 mL IntraVENous RAD ONCE     Current Outpatient Prescriptions   Medication Sig   ??? mesalamine (LIALDA) 1.2 gram delayed release tablet Take 2.4 g by mouth Daily (before breakfast).   ??? Lactobac2-Bifido1-Strep therm. (VSL#3) 112.5 billion cell cap Take 1 Cap by mouth two (2) times a day.   ??? Bifidobacterium Infantis (ALIGN) 4 mg cap Take 1 Cap by mouth two (2) times a day.   ??? acetaminophen (TYLENOL ARTHRITIS PAIN) 650 mg CR tablet Take 650 mg by mouth three (3) times daily.   ??? metroNIDAZOLE (FLAGYL) 500 mg tablet Take 500 mg by mouth three (3) times daily.   ??? amoxicillin-clavulanate (AUGMENTIN) 875-125 mg per tablet Take 1 Tab by mouth two (2) times a day.   ??? esomeprazole (NEXIUM) 40 mg capsule Take  by mouth daily.   ??? LORazepam (ATIVAN) 1 mg tablet Take 1 mg by mouth every four (4) hours as needed for Anxiety.   ??? dicyclomine (BENTYL) 20 mg tablet Take 20 mg by mouth every eight (8) hours.   ??? bismuth subsalicylate (PEPTO-BISMOL) 262 mg chew Take 2 Tabs by mouth every three (3) hours as needed.   ??? loperamide (IMMODIUM) 2 mg tablet Take 2 mg by mouth four (4) times daily as needed for Diarrhea.   ??? cholecalciferol (VITAMIN D3) 1,000 unit tablet Take 3,000 Units by mouth two (2) times a day.   ??? multivitamin (ONE A DAY) tablet Take 1 Tab by mouth daily.    ??? AMBIEN 10 mg tablet Take 1 Tab by mouth nightly  as needed for Sleep. Max Daily Amount: 10 mg. Medically necessary for branded.       Social History:  Social History   Substance Use Topics   ??? Smoking status: Never Smoker   ??? Smokeless tobacco: Never Used   ??? Alcohol use 1.2 oz/week     2 Glasses of wine per week       Family History:  History reviewed. No pertinent family history.    Review of Systems:  Constitutional: negative fever, + chills, negative weight loss  Eyes:   negative visual changes  ENT:   negative sore throat, tongue or lip swelling  Respiratory:  negative cough, negative dyspnea  Cards:  negative for chest pain, palpitations, lower extremity edema  GI:   See HPI  GU:  negative for frequency, dysuria  Integument:  negative for rash and pruritus  Heme:  negative for easy bruising and gum/nose bleeding  Musculoskel: negative for myalgias, back pain and muscle weakness  Neuro: negative for headaches, dizziness, vertigo  Psych:  negative for feelings of anxiety, depression     Objective:   Patient Vitals for the past 8 hrs:   BP Temp Pulse Resp SpO2 Height Weight   02/29/16 1523 141/88 98.1 ??F (36.7 ??C) 74 14 98 % - -   02/29/16 1312 156/88 98 ??F (36.7 ??C) 83 20 97 % 5\' 4"  (1.626 m) 76.8 kg (169 lb 6.4 oz)             EXAM:     CONST:  Pleasant woman in bed in no acute distress. Sitting on edge of bed   NEURO:  alert and oriented x 3   HEENT: EOMI, no scleral icterus   LUNGS: clear to ausculation, (-) wheeze   CARD:  regular rate and rhythm, S1 S2   ABD:  soft, minor tenderness LLQ, no rebound, bowel sounds (+) all 4 quadrants, no masses, non distended   EXT:  no edema, warm   PSYCH: full, not anxious     Data Review     Recent Labs      02/29/16   1345   WBC  16.1*   HGB  14.9   HCT  43.7   PLT  457*     Recent Labs      02/29/16   1345   NA  133*   K  4.3   CL  97   CO2  27   BUN  11   CREA  0.89   GLU  151*   CA  10.3*     Recent Labs      02/29/16   1345   SGOT  14*   AP  139*   TP  9.3*    ALB  4.6   GLOB  4.7*   LPSE  103     No results for input(s): INR, PTP, APTT in the last 72 hours.    No lab exists for component: INREXT       Assessment:   ?? LLQ abdominal pain: complex case with extensive work up. Sounds more like diverticulitis vs inflammatory disease. Plus no IBD on colonoscopy. Surgery may be potential endpoint.   There is no problem list on file for this patient.    Plan:   ?? Await results of CT scan  ?? No med changes at this time  ?? If CT with no acute findings, d/c home and keep appt with Dr. Nolon Bussing  ?? Thank you for allowing  me to participate in care of Crystal Hughes     Signed By: Margaretmary Dys. Malen Gauze, NP     02/29/2016  4:53 PM       I have examined the patient.  I have reviewed the chart and agree with the documentation recorded by the NP, including the assessment, treatment plan, and disposition.      Discussed the above plan with pt and Dr. Carley Hammed, MD

## 2016-02-29 NOTE — ED Provider Notes (Signed)
HPI Comments: 60 y.o. female with past medical history significant for cholecystectomy and fibromyalgia who presents accompanied by her daughter with chief complaint of abdominal pain.  The pt c/o LLQ abdominal pain that has been an ongoing for over a year. The pt currently rates her pain as a 2 out of 10 and says that her pain is increasing. The pt reports that her pain will radiate to the LUQ. The pt explains that she was diagnosed with diverticulitis 13 months ago that has not been responding to ABX, including Cipro. The pt explains that she has seen multiple physicians for her abdominal pain and has had 4 CT scans in the past year. The pt reports that she [facility] who recommended a colonoscopy and an endoscopy. The pt says that they took multiple biopsies at the time, including one of the stomach, and she was negative for UC. The pt reports that the colonoscopy showed a small bowel ileus and an inflamed sigmoid colon. The pt says that she was started on Mesalamine and a course of prednisone 7 months ago for SCAD (segmental colitis associated with diverticulitis). The pt noted significant relief from prednisone. The pt reports that she has followed up with [facility], but there has been some debate as to whether her not her most recent CT scan showed diverticulitis or just inflammation. The pt notes that she was started on Flagyl and Augmentin 5 days ago and a CT revealed a "blob" in the abdomen. The pt says that Dr. Delorise JacksonBickston believes that it will take a while to convince her insurance to have a repeat CT scan to evaluate again, but she has not been able to get in touch with him for the past 5 days. The pt notes that she has recently seen her PCP for her abdominal pain. The pt says that she has an appointment with Dr. Nolon BussingSrivastava in 2 weeks. The pt believes that Cipro is causing tendonitis in  her arms. The pt notes that she can have some difficulty breathing during a flare up of her diverticulitis. The pt reports increased urinary frequency. Denies blood and mucus in stools, cough, wheezing, and regular SOB. There are no other acute medical concerns at this time.      Social hx: Nonsmoker. Occasional ETOH use.  PCP: Ladona RidgelMichael E Pitzer, MD    Note written by Andres EgeLuke Yesbeck, Scribe, as dictated by Evelena Leydenean C Raynell Scott, MD 2:49 PM      The history is provided by the patient. No language interpreter was used.        Past Medical History:   Diagnosis Date   ??? Ill-defined condition     Fibromyalgia       Past Surgical History:   Procedure Laterality Date   ??? HX CHOLECYSTECTOMY           History reviewed. No pertinent family history.    Social History     Social History   ??? Marital status: MARRIED     Spouse name: N/A   ??? Number of children: N/A   ??? Years of education: N/A     Occupational History   ??? Not on file.     Social History Main Topics   ??? Smoking status: Never Smoker   ??? Smokeless tobacco: Never Used   ??? Alcohol use 1.2 oz/week     2 Glasses of wine per week   ??? Drug use: No   ??? Sexual  activity: Yes     Partners: Male     Birth control/ protection: None     Other Topics Concern   ??? Not on file     Social History Narrative         ALLERGIES: Codeine and Metronidazole    Review of Systems   Constitutional: Negative for appetite change, chills and fever.   HENT: Negative for congestion.    Eyes: Negative for visual disturbance.   Respiratory: Negative for cough, chest tightness, shortness of breath and wheezing.    Cardiovascular: Negative for chest pain.   Gastrointestinal: Positive for abdominal pain (LLQ) and diarrhea. Negative for vomiting.   Genitourinary: Positive for frequency (increased). Negative for dysuria.   Musculoskeletal: Negative for joint swelling.   Skin: Negative for rash.   Neurological: Negative for speech difficulty and headaches.   All other systems reviewed and are negative.      Vitals:     02/29/16 1312   BP: 156/88   Pulse: 83   Resp: 20   Temp: 98 ??F (36.7 ??C)   SpO2: 97%   Weight: 76.8 kg (169 lb 6.4 oz)   Height:  (1.626 m)            Physical Exam   Constitutional: She is oriented to person, place, and time. She appears well-developed and well-nourished. No distress.   HENT:   Head: Normocephalic and atraumatic.   Nose: Nose normal.   Eyes: Conjunctivae are normal. Pupils are equal, round, and reactive to light. No scleral icterus.   Neck: Normal range of motion. Neck supple. No JVD present. No tracheal deviation present. No thyromegaly present.   Cardiovascular: Normal rate, regular rhythm and normal heart sounds.    No murmur heard.  Pulmonary/Chest: Effort normal and breath sounds normal. No respiratory distress. She has no wheezes. She has no rales.   Abdominal: Soft. Bowel sounds are normal. She exhibits no mass. There is tenderness in the left lower quadrant. There is no rebound and no guarding.   Musculoskeletal: Normal range of motion. She exhibits no edema.   Neurological: She is alert and oriented to person, place, and time. No cranial nerve deficit. Coordination normal.   Skin: Skin is warm and dry. No rash noted. She is not diaphoretic. No erythema.   Psychiatric: She has a normal mood and affect. Her behavior is normal.   Nursing note and vitals reviewed.   Note written by Andres Ege, Scribe, as dictated by Evelena Leyden, MD 2:49 PM     MDM  ED Course       Procedures           PROGRESS NOTE:  3:53 PM The NP for Dr. Genia Hotter will see the pt.

## 2016-02-29 NOTE — ED Notes (Signed)
Patient verbalizes understanding of discharge instructions. Pt alert and oriented, appears in no acute distress, respirations equal and unlabored. Ambulatory upon discharge with steady gait.

## 2016-03-02 MED ORDER — POVIDONE-IODINE 10 % OINTMENT
10 % | CUTANEOUS | Status: AC
Start: 2016-03-02 — End: ?

## 2016-03-02 MED FILL — POVIDONE-IODINE 10 % OINTMENT: 10 % | CUTANEOUS | Qty: 28.35

## 2016-03-09 ENCOUNTER — Encounter: Attending: Internal Medicine | Primary: Family Medicine

## 2016-03-12 ENCOUNTER — Encounter: Attending: Obstetrics & Gynecology | Primary: Family Medicine

## 2016-03-12 IMAGING — CT CT ABD-PELV W/ CM
2 of 5 series · 17 of 46 positions shown, 19 images · IV contrast (OMNIPAQUE)
Comparison: 03/28/2015

CLINICAL DATA: LEFT lower quadrant pain with nausea, leukocytosis,
history diverticulitis

EXAM:
CT ABDOMEN AND PELVIS WITH CONTRAST
TECHNIQUE: Multidetector CT imaging of the abdomen and pelvis was performed
using the standard protocol following bolus administration of
intravenous contrast. Sagittal and coronal MPR images reconstructed
from axial data set.
CONTRAST:  100mL OMNIPAQUE IOHEXOL 300 MG/ML SOLN IV. Dilute oral
contrast.

[Series 2: rtn a/p with · axial · 0.73mm/px · z∈[+823,+1243]mm · 14 of 94 slices shown, 16 images]
[im 5/94  soft-tissue]
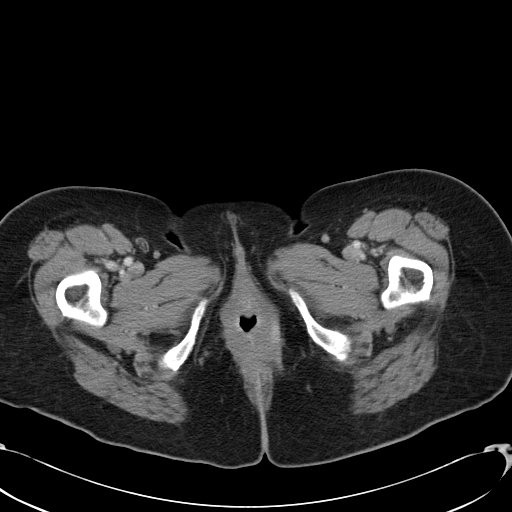
[im 5/94  bone]
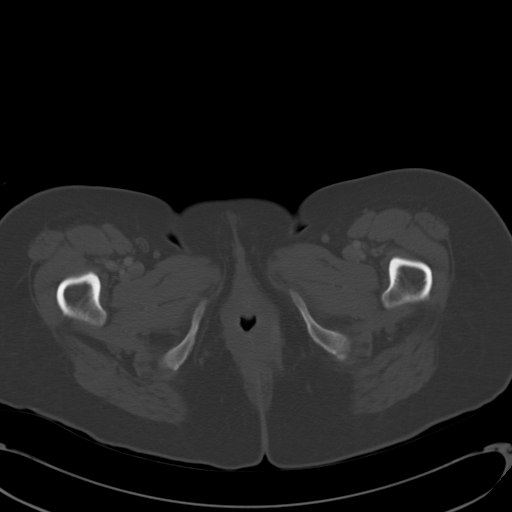
[im 10/94  soft-tissue]
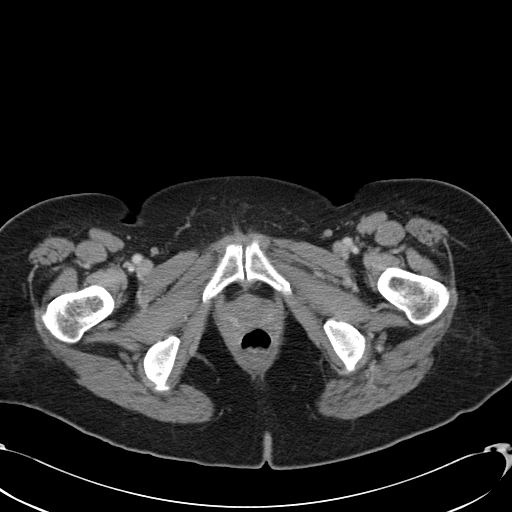
[im 20/94  soft-tissue]
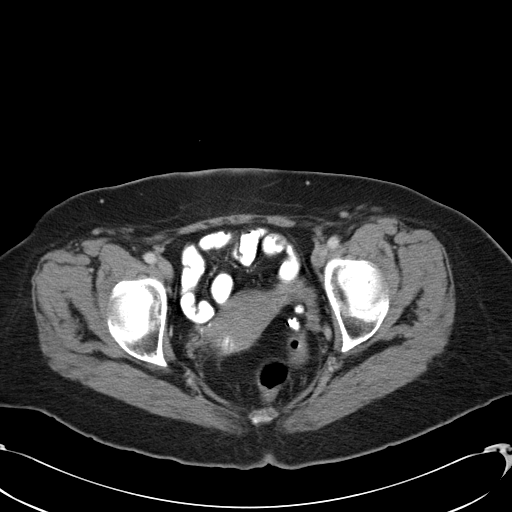
[im 25/94  soft-tissue]
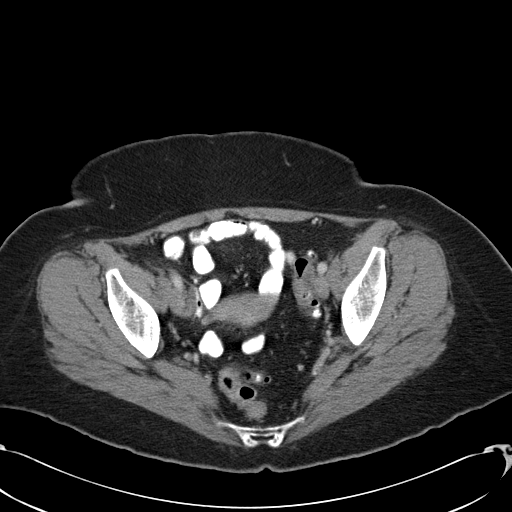
[im 30/94  soft-tissue]
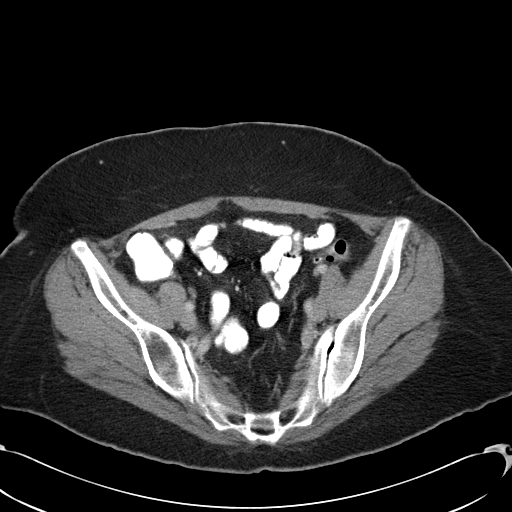
[im 40/94  soft-tissue]
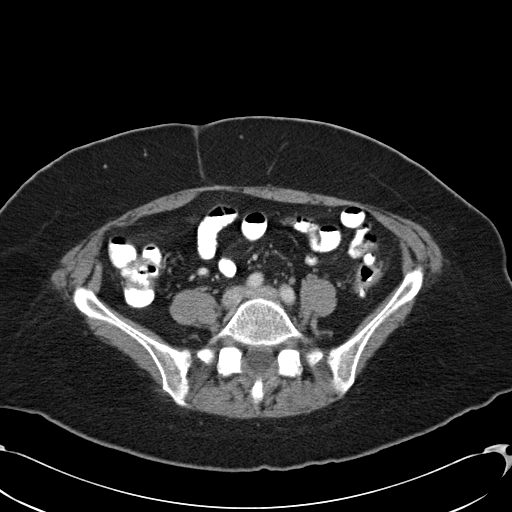
[im 45/94  soft-tissue]
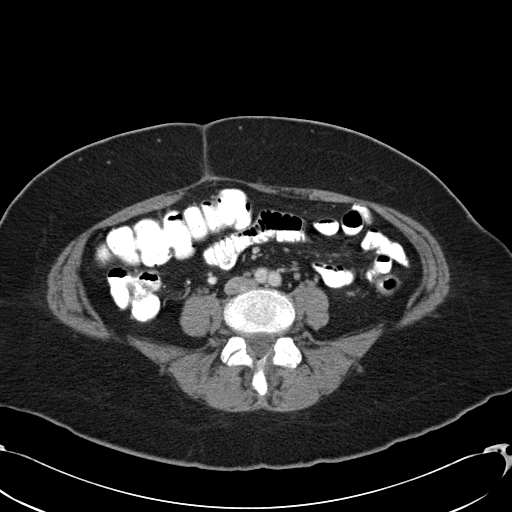
[im 49/94  soft-tissue]
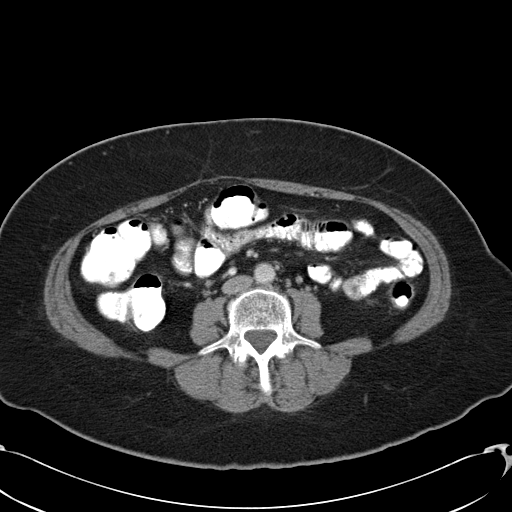
[im 54/94  soft-tissue]
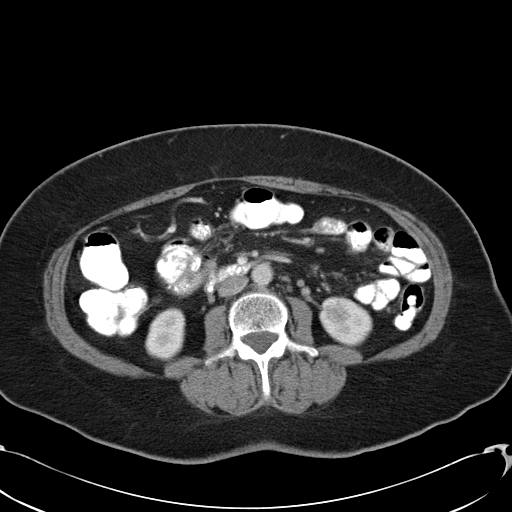
[im 54/94  bone]
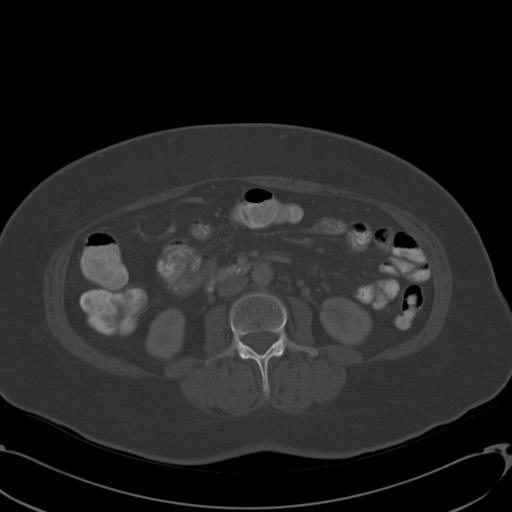
[im 64/94  soft-tissue]
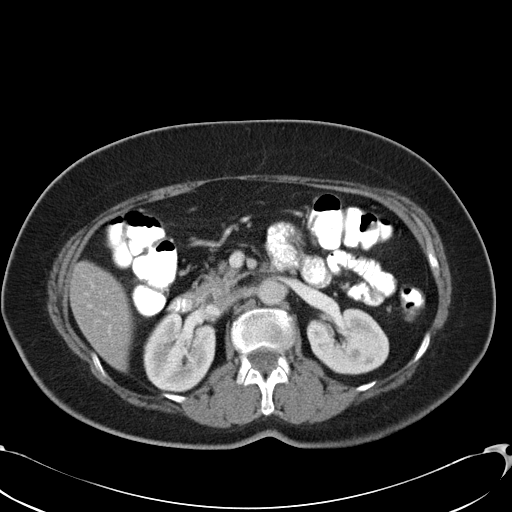
[im 69/94  soft-tissue]
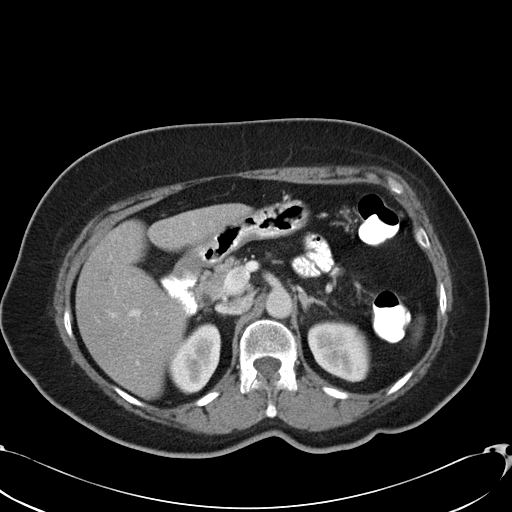
[im 74/94  soft-tissue]
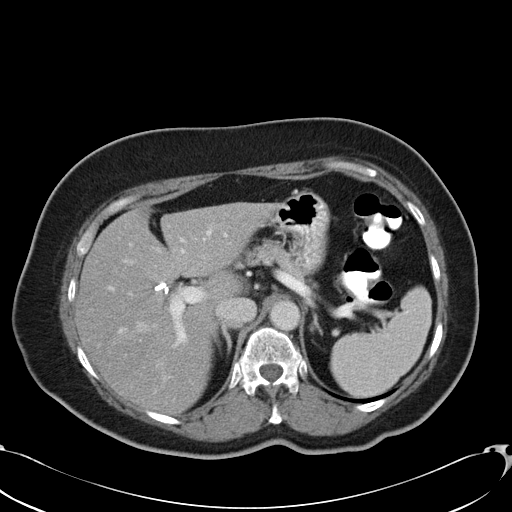
[im 84/94  soft-tissue]
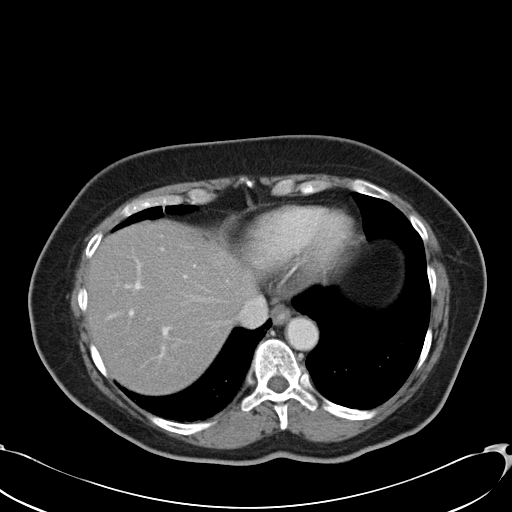
[im 89/94  soft-tissue]
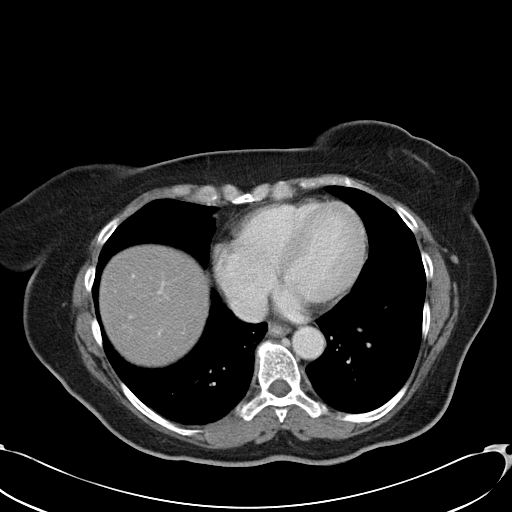

[Series 602: coronal · coronal · 0.91mm/px · 3 of 72 slices shown]
[im 24/72  soft-tissue]
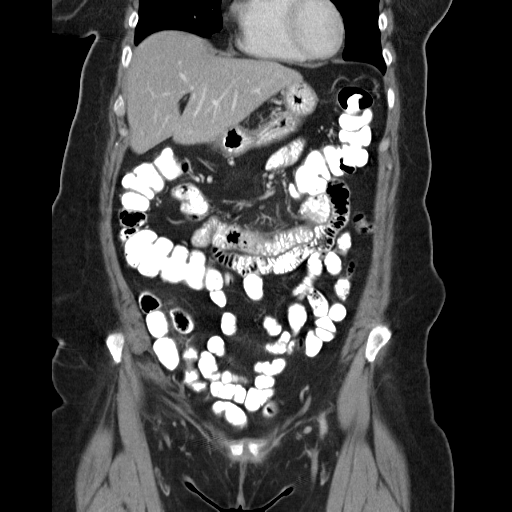
[im 32/72  soft-tissue]
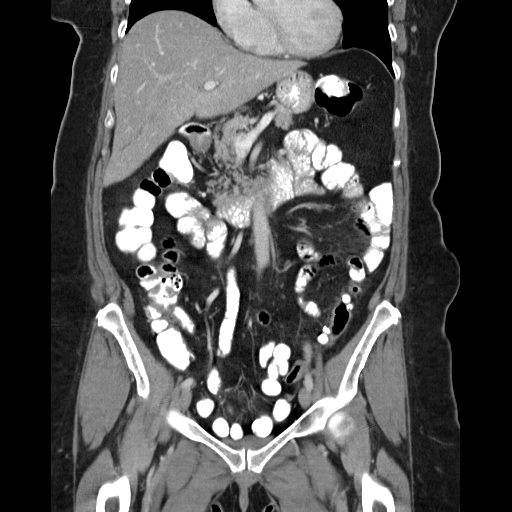
[im 40/72  soft-tissue]
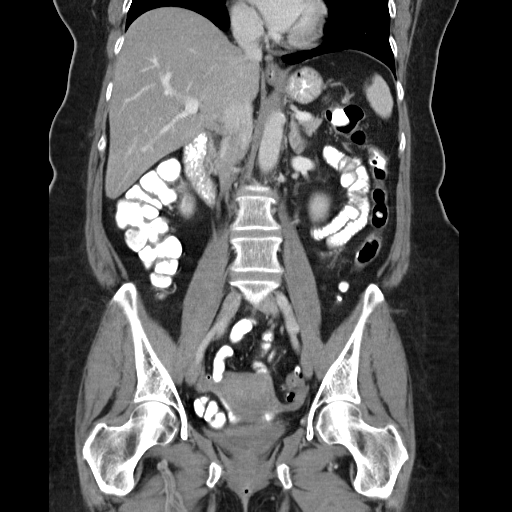

[17 of 46 positions shown; findings below may reference images not displayed]

FINDINGS: Lung bases clear.

Gallbladder surgically absent.

Minimal fatty infiltration of liver.

Small LEFT renal cysts.

Liver, spleen, pancreas, kidneys, and adrenal glands otherwise
normal appearance.

Normal appendix.

Distal colonic diverticulosis with minimal sigmoid wall thickening.

Pericolic inflammatory changes seen previously have resolved.

Stomach and remaining bowel loops normal appearance.

Decompressed bladder, distal ureters, uterus and adnexa
unremarkable.

No mass, adenopathy, free fluid, or free air.

Tiny umbilical hernia containing fat.

Osseous structures unremarkable.
IMPRESSION: Distal colonic diverticulosis with mild wall thickening at the
sigmoid colon similar distribution to that seen on the previous exam
likely representing residual changes from acute diverticulitis.

Resolution of pericolic inflammatory changes seen on previous exam.

Minimal fatty infiltration of liver.

Small LEFT renal cysts.

Tiny umbilical hernia containing fat.

## 2016-03-16 ENCOUNTER — Inpatient Hospital Stay: Admit: 2016-03-16 | Payer: BLUE CROSS/BLUE SHIELD | Attending: Rheumatology | Primary: Family Medicine

## 2016-03-16 ENCOUNTER — Ambulatory Visit

## 2016-03-16 DIAGNOSIS — M457 Ankylosing spondylitis of lumbosacral region: Secondary | ICD-10-CM

## 2016-04-09 ENCOUNTER — Ambulatory Visit
Admit: 2016-04-09 | Discharge: 2016-04-09 | Payer: PRIVATE HEALTH INSURANCE | Attending: Internal Medicine | Primary: Family Medicine

## 2016-04-09 DIAGNOSIS — R42 Dizziness and giddiness: Secondary | ICD-10-CM

## 2016-04-09 NOTE — Patient Instructions (Signed)
Labs today  follow up with your Gastroenterologist as planned  Nutrition referral as requested  follow up with your PCP   Call or return to clinic if these symptoms worsen or fail to improve as anticipated.

## 2016-04-09 NOTE — Progress Notes (Signed)
Weak and dizzy, diarrhea daily, taking imodium which helps  "flair of colitis" started 8 weeks  Saw GI doctor on July 11 (Dr. Delorise Jackson)  Small bowel series ordered  GI doctor does not want to prescribe pain medication, want PCP to handle that  Patient states her normal temp is 97.3, 98.6 today

## 2016-04-09 NOTE — Progress Notes (Signed)
HISTORY OF PRESENT ILLNESS  Crystal ABRUZZESE is a 60 y.o. female.  HPI Patient is a 60 yo woman who saw Dr. Cliffton Asters for her initial visit on 01/06/16. Previously, she lived in [place], and was treated followed at [facility]. She presents today with feeling lightheaded, and fatigued x 2-3 days.  Symptoms are constant and are unaffected by movement, or change in position. Patient denies chest pain, palpitations, shortness of breath, melena or hematochezia, syncope or presyncope. She denies headache, visual changes, sensory changes or weakness. She thinks she is feeling this way because her nutrition has been poor. She reports limiting her po intake as she develops lower abdominal pain and diarrhea with eating. She is drinking fluids, and denies orthostatic symptoms.  Patient states that she has felt this way in the past when she has had flares in her colitis. She is wondering about how to improve her nutrition. She spoke with the Nurse Practitioner at  Dr. Alvera Singh office and was told that she has no dietary restrictions. She would like to see a nutritionist to discuss her diet.  Since she was seen by Dr. Cliffton Asters, patient has seen multiple other providers. She is followed by Dr. Delorise Jackson, her Gastroenterologist,  most recently seen on 7/11, and being treated with Lialda and Prednisone taper for presumed inflammatory bowel disease. She states that she is completing her Prednisone taper tomorrow. Patient reports that she is scheduled to have a small bowel series on 04/25/16.  She was also evaluated at [facility] ER on 02/29/16 with abdominal pain. She had a negative abdominal/pelvic CT, was seen by Dr. Janetta Hora, and discharged. She followed up with Dr. Nolon Bussing, but does not plan to return. Instead, she plans to continue with Dr. Delorise Jackson at [facility] for her GI care.  Since seeing Dr. Cliffton Asters in April, she also has had 2 visits with Dr. Nicholes Stairs, who is listed as her PCP. It is not clear, why she was seeing 2  PCP's, but she states that she plans to return to Dr. Cliffton Asters.  Patient also has had multiple visits with Dr. Darylene Price for fibromyalgia, and ankylosing spondylitis          Past Medical History:   Diagnosis Date   ??? Ill-defined condition     Fibromyalgia     Current Outpatient Prescriptions on File Prior to Visit   Medication Sig Dispense Refill   ??? mesalamine (LIALDA) 1.2 gram delayed release tablet Take 3.6 g by mouth Daily (before breakfast).     ??? Lactobac2-Bifido1-Strep therm. (VSL#3) 112.5 billion cell cap Take 1 Cap by mouth two (2) times a day.     ??? Bifidobacterium Infantis (ALIGN) 4 mg cap Take 1 Cap by mouth two (2) times a day.     ??? acetaminophen (TYLENOL ARTHRITIS PAIN) 650 mg CR tablet Take 650 mg by mouth three (3) times daily.     ??? esomeprazole (NEXIUM) 40 mg capsule Take  by mouth daily.     ??? dicyclomine (BENTYL) 20 mg tablet Take 20 mg by mouth every eight (8) hours.     ??? loperamide (IMMODIUM) 2 mg tablet Take 2 mg by mouth four (4) times daily as needed for Diarrhea.     ??? cholecalciferol (VITAMIN D3) 1,000 unit tablet Take 3,000 Units by mouth two (2) times a day.     ??? multivitamin (ONE A DAY) tablet Take 1 Tab by mouth daily.     ??? AMBIEN 10 mg tablet Take 1 Tab by mouth  nightly as needed for Sleep. Max Daily Amount: 10 mg. Medically necessary for branded. 90 Tab 0   ??? traMADol (ULTRAM) 50 mg tablet Take 2 Tabs by mouth every eight (8) hours as needed for Pain. Max Daily Amount: 300 mg. 50 Tab 0   ??? LORazepam (ATIVAN) 1 mg tablet Take 1 mg by mouth every four (4) hours as needed for Anxiety.     ??? bismuth subsalicylate (PEPTO-BISMOL) 262 mg chew Take 2 Tabs by mouth every three (3) hours as needed.       No current facility-administered medications on file prior to visit.        ROS  Per HPI  Physical Exam  Visit Vitals   ??? BP 130/90 (BP 1 Location: Right arm, BP Patient Position: Sitting)   ??? Pulse 86   ??? Temp 98.6 ??F (37 ??C)   ??? Resp 12   ??? Ht 5\' 4"  (1.626 m)   ??? Wt 163 lb 3.2 oz (74 kg)    ??? SpO2 98%   ??? BMI 28.01 kg/m2     Patient appears well  Neck: no adenopathy or thyromegaly, carotids 2+ no bruits  Heart:: normal rate, regular rhythm, normal S1, S2, no murmurs, rubs, clicks or gallops.  Chest: clear to auscultation, no wheezes, rales or rhonchi,   Abdomen: soft, normal bowel sounds throughout, mild tenderness across lower abdomen, no rebound or guarding, no mass or hepatosplenomegaly   Extremities: no edema  Neuro: alert and oriented  Cranial nerves II-XII intact  Normal strength  Reflexes 2+ symmetrical  ASSESSMENT and PLAN  Fatigue ? From underlying IBD, ? Symptomatic from tapering off Prednisone  Lightheaded  Inflammatory bowel disease  Will check labs, CBC, CMP, Magnesium  Patient to follow up with Dr. Delorise Jackson, GI, and will be getting her small bowel series next month  Recommended she get records from Dr. Yetta Numbers for our chart  She will schedule an appointment with Nutritionist  Recommended she schedule a follow up with Dr. Cliffton Asters

## 2016-04-10 LAB — CBC WITH AUTOMATED DIFF
ABS. BASOPHILS: 0 10*3/uL (ref 0.0–0.2)
ABS. EOSINOPHILS: 0.2 10*3/uL (ref 0.0–0.4)
ABS. IMM. GRANS.: 0 10*3/uL (ref 0.0–0.1)
ABS. MONOCYTES: 0.8 10*3/uL (ref 0.1–0.9)
ABS. NEUTROPHILS: 6.8 10*3/uL (ref 1.4–7.0)
Abs Lymphocytes: 4.6 10*3/uL — ABNORMAL HIGH (ref 0.7–3.1)
BASOPHILS: 0 %
EOSINOPHILS: 2 %
HCT: 42.1 % (ref 34.0–46.6)
HGB: 13.6 g/dL (ref 11.1–15.9)
IMMATURE GRANULOCYTES: 0 %
Lymphocytes: 37 %
MCH: 29.8 pg (ref 26.6–33.0)
MCHC: 32.3 g/dL (ref 31.5–35.7)
MCV: 92 fL (ref 79–97)
MONOCYTES: 6 %
NEUTROPHILS: 55 %
PLATELET: 491 10*3/uL — ABNORMAL HIGH (ref 150–379)
RBC: 4.57 x10E6/uL (ref 3.77–5.28)
RDW: 15.1 % (ref 12.3–15.4)
WBC: 12.4 10*3/uL — ABNORMAL HIGH (ref 3.4–10.8)

## 2016-04-10 LAB — HEMOGLOBIN A1C WITH EAG
Estimated average glucose: 137 mg/dL
Hemoglobin A1c: 6.4 % — ABNORMAL HIGH (ref 4.8–5.6)

## 2016-04-10 LAB — METABOLIC PANEL, COMPREHENSIVE
A-G Ratio: 1.9 (ref 1.2–2.2)
ALT (SGPT): 12 IU/L (ref 0–32)
AST (SGOT): 11 IU/L (ref 0–40)
Albumin: 4.7 g/dL (ref 3.6–4.8)
Alk. phosphatase: 91 IU/L (ref 39–117)
BUN/Creatinine ratio: 16 (ref 12–28)
BUN: 10 mg/dL (ref 8–27)
Bilirubin, total: 0.2 mg/dL (ref 0.0–1.2)
CO2: 28 mmol/L (ref 18–29)
Calcium: 10 mg/dL (ref 8.7–10.3)
Chloride: 97 mmol/L (ref 96–106)
Creatinine: 0.64 mg/dL (ref 0.57–1.00)
GFR est AA: 112 mL/min/{1.73_m2} (ref >59)
GFR est non-AA: 97 mL/min/{1.73_m2} (ref >59)
GLOBULIN, TOTAL: 2.5 g/dL (ref 1.5–4.5)
Glucose: 102 mg/dL — ABNORMAL HIGH (ref 65–99)
Potassium: 4.4 mmol/L (ref 3.5–5.2)
Protein, total: 7.2 g/dL (ref 6.0–8.5)
Sodium: 142 mmol/L (ref 134–144)

## 2016-04-10 LAB — MAGNESIUM: Magnesium: 2.2 mg/dL (ref 1.6–2.3)

## 2016-04-13 NOTE — Progress Notes (Signed)
No additional comment

## 2016-04-27 ENCOUNTER — Encounter: Attending: Internal Medicine | Primary: Family Medicine

## 2018-06-23 ENCOUNTER — Inpatient Hospital Stay
Admit: 2018-06-23 | Discharge: 2018-06-23 | Disposition: A | Discharge status: Home / Self Care | Payer: PRIVATE HEALTH INSURANCE | Attending: Emergency Medicine

## 2018-06-23 ENCOUNTER — Emergency Department: Admit: 2018-06-23 | Payer: PRIVATE HEALTH INSURANCE | Primary: Family Medicine

## 2018-06-23 DIAGNOSIS — R51 Headache: Secondary | ICD-10-CM

## 2018-06-23 MED ORDER — KETOROLAC TROMETHAMINE 30 MG/ML INJECTION
30 mg/mL (1 mL) | INTRAMUSCULAR | Status: AC
Start: 2018-06-23 — End: 2018-06-23
  Administered 2018-06-23: 15:00:00 via INTRAVENOUS

## 2018-06-23 MED ORDER — DIPHENHYDRAMINE HCL 50 MG/ML IJ SOLN
50 mg/mL | INTRAMUSCULAR | Status: AC
Start: 2018-06-23 — End: 2018-06-23
  Administered 2018-06-23: 15:00:00 via INTRAVENOUS

## 2018-06-23 MED ORDER — PROCHLORPERAZINE EDISYLATE 5 MG/ML INJECTION
5 mg/mL | INTRAMUSCULAR | Status: AC
Start: 2018-06-23 — End: 2018-06-23
  Administered 2018-06-23: 18:00:00 via INTRAVENOUS

## 2018-06-23 MED ORDER — METOCLOPRAMIDE 5 MG/ML IJ SOLN
5 mg/mL | INTRAMUSCULAR | Status: AC
Start: 2018-06-23 — End: 2018-06-23
  Administered 2018-06-23: 15:00:00 via INTRAVENOUS

## 2018-06-23 MED FILL — METOCLOPRAMIDE 5 MG/ML IJ SOLN: 5 mg/mL | INTRAMUSCULAR | Qty: 4

## 2018-06-23 MED FILL — PROCHLORPERAZINE EDISYLATE 5 MG/ML INJECTION: 5 mg/mL | INTRAMUSCULAR | Qty: 2

## 2018-06-23 MED FILL — KETOROLAC TROMETHAMINE 30 MG/ML INJECTION: 30 mg/mL (1 mL) | INTRAMUSCULAR | Qty: 1

## 2018-06-23 MED FILL — DIPHENHYDRAMINE HCL 50 MG/ML IJ SOLN: 50 mg/mL | INTRAMUSCULAR | Qty: 1

## 2018-06-23 NOTE — ED Notes (Signed)
Triage: Patient arrives from home with c/o severe headache that has been going on for a few weeks now.  Patient decided to come in today due to the increase in severity and duration.  Patient states she n/v and diaphoretic with these headaches.

## 2018-06-23 NOTE — ED Notes (Signed)
Patient ambulatory to bathroom with steady gait.

## 2018-06-23 NOTE — ED Notes (Signed)
Patient going to CT

## 2018-06-23 NOTE — ED Provider Notes (Signed)
HPI .  Patient has a history of a "undefined pain syndrome", fibromyalgia, "colitis from antibiotics", diverticulosis, cholecystectomy, and seems to be on medications for irritable bowel syndrome.  Patient says she just finished a Z-Pak for a cough.  She says her husband was diagnosed with pertussis recently.  She says she feels like she is getting colitis from taking the Z-Pak.  Chief complaint is headache.  Pain involves the eyes and the top of the head.  Patient usually wakes up with a headache in the morning.  Patient takes Advil and Tylenol and headache usually goes away later in the morning.  Headache is been going on for greater than a few weeks.  Patient does not complain of syncope or fever.  Past Medical History:   Diagnosis Date   ??? Ill-defined condition     Fibromyalgia   ??? Psychiatric disorder     depression       Past Surgical History:   Procedure Laterality Date   ??? HX CHOLECYSTECTOMY           No family history on file.    Social History     Socioeconomic History   ??? Marital status: MARRIED     Spouse name: Not on file   ??? Number of children: Not on file   ??? Years of education: Not on file   ??? Highest education level: Not on file   Occupational History   ??? Not on file   Social Needs   ??? Financial resource strain: Not on file   ??? Food insecurity:     Worry: Not on file     Inability: Not on file   ??? Transportation needs:     Medical: Not on file     Non-medical: Not on file   Tobacco Use   ??? Smoking status: Never Smoker   ??? Smokeless tobacco: Never Used   Substance and Sexual Activity   ??? Alcohol use: Yes     Alcohol/week: 2.0 standard drinks     Types: 2 Glasses of wine per week   ??? Drug use: No   ??? Sexual activity: Yes     Partners: Male     Birth control/protection: None   Lifestyle   ??? Physical activity:     Days per week: Not on file     Minutes per session: Not on file   ??? Stress: Not on file   Relationships   ??? Social connections:     Talks on phone: Not on file     Gets together: Not on file      Attends religious service: Not on file     Active member of club or organization: Not on file     Attends meetings of clubs or organizations: Not on file     Relationship status: Not on file   ??? Intimate partner violence:     Fear of current or ex partner: Not on file     Emotionally abused: Not on file     Physically abused: Not on file     Forced sexual activity: Not on file   Other Topics Concern   ??? Not on file   Social History Narrative   ??? Not on file         ALLERGIES: Cephalosporins; Codeine; Metronidazole; and Sulfa (sulfonamide antibiotics)    Review of Systems   All other systems reviewed and are negative.      Vitals:    06/23/18 1030  BP: 173/84   Pulse: 64   Resp: 18   Temp: 97.4 ??F (36.3 ??C)   SpO2: 97%            Physical Exam   Constitutional: She appears well-developed and well-nourished.   HENT:   Head: Normocephalic and atraumatic.   Mouth/Throat: Oropharynx is clear and moist.   Eyes: Pupils are equal, round, and reactive to light. EOM are normal.   Neck: Normal range of motion. Neck supple.   Cardiovascular: Normal rate, regular rhythm, normal heart sounds and intact distal pulses. Exam reveals no gallop and no friction rub.   No murmur heard.  Pulmonary/Chest: Effort normal. No respiratory distress. She has no wheezes. She has no rales.   Abdominal: Soft. There is no tenderness. There is no rebound.   Musculoskeletal: Normal range of motion. She exhibits no tenderness.   Neurological: She is alert. No cranial nerve deficit.   Motor; symmetric   Skin: No erythema.   Psychiatric: She has a normal mood and affect. Her behavior is normal.   Nursing note and vitals reviewed.       MDM       Procedures          Headache is much better patient will be discharged;   Raynald KempJoseph P Alizah Sills, MD  2:09 PM

## 2018-06-23 NOTE — ED Notes (Signed)
Patient discharged home. Removed peripheral IV. Educated patient on discharge instructions. . Provided her with written copies of discharge instructions and prescriptions. Opportunity for questions to be answered. Discharged via steady gait.

## 2018-06-23 NOTE — ED Notes (Signed)
Patient going to CT

## 2018-06-23 NOTE — ED Triage Notes (Addendum)
Triage: Patient arrives from home with c/o severe headache that has been going on for a few weeks now.  Patient decided to come in today due to the increase in severity and duration.  Patient states she n/v and diaphoretic with these headaches.

## 2018-06-23 NOTE — ED Provider Notes (Addendum)
HPI .  Patient has a history of a "undefined pain syndrome", fibromyalgia, "colitis from antibiotics", diverticulosis, cholecystectomy, and seems to be on medications for irritable bowel syndrome.  Patient says she just finished a Z-Pak for a cough.  She says her husband was diagnosed with pertussis recently.  She says she feels like she is getting colitis from taking the Z-Pak.  Chief complaint is headache.  Pain involves the eyes and the top of the head.  Patient usually wakes up with a headache in the morning.  Patient takes Advil and Tylenol and headache usually goes away later in the morning.  Headache is been going on for greater than a few weeks.  Patient does not complain of syncope or fever.  Past Medical History:   Diagnosis Date   ??? Ill-defined condition     Fibromyalgia   ??? Psychiatric disorder     depression       Past Surgical History:   Procedure Laterality Date   ??? HX CHOLECYSTECTOMY           No family history on file.    Social History     Socioeconomic History   ??? Marital status: MARRIED     Spouse name: Not on file   ??? Number of children: Not on file   ??? Years of education: Not on file   ??? Highest education level: Not on file   Occupational History   ??? Not on file   Social Needs   ??? Financial resource strain: Not on file   ??? Food insecurity:     Worry: Not on file     Inability: Not on file   ??? Transportation needs:     Medical: Not on file     Non-medical: Not on file   Tobacco Use   ??? Smoking status: Never Smoker   ??? Smokeless tobacco: Never Used   Substance and Sexual Activity   ??? Alcohol use: Yes     Alcohol/week: 2.0 standard drinks     Types: 2 Glasses of wine per week   ??? Drug use: No   ??? Sexual activity: Yes     Partners: Male     Birth control/protection: None   Lifestyle   ??? Physical activity:     Days per week: Not on file     Minutes per session: Not on file   ??? Stress: Not on file   Relationships   ??? Social connections:     Talks on phone: Not on file     Gets together: Not on file      Attends religious service: Not on file     Active member of club or organization: Not on file     Attends meetings of clubs or organizations: Not on file     Relationship status: Not on file   ??? Intimate partner violence:     Fear of current or ex partner: Not on file     Emotionally abused: Not on file     Physically abused: Not on file     Forced sexual activity: Not on file   Other Topics Concern   ??? Not on file   Social History Narrative   ??? Not on file         ALLERGIES: Cephalosporins; Codeine; Metronidazole; and Sulfa (sulfonamide antibiotics)    Review of Systems   All other systems reviewed and are negative.      Vitals:    06/23/18 1030     BP: 173/84   Pulse: 64   Resp: 18   Temp: 97.4 ??F (36.3 ??C)   SpO2: 97%            Physical Exam   Constitutional: She appears well-developed and well-nourished.   HENT:   Head: Normocephalic and atraumatic.   Mouth/Throat: Oropharynx is clear and moist.   Eyes: Pupils are equal, round, and reactive to light. EOM are normal.   Neck: Normal range of motion. Neck supple.   Cardiovascular: Normal rate, regular rhythm, normal heart sounds and intact distal pulses. Exam reveals no gallop and no friction rub.   No murmur heard.  Pulmonary/Chest: Effort normal. No respiratory distress. She has no wheezes. She has no rales.   Abdominal: Soft. There is no tenderness. There is no rebound.   Musculoskeletal: Normal range of motion. She exhibits no tenderness.   Neurological: She is alert. No cranial nerve deficit.   Motor; symmetric   Skin: No erythema.   Psychiatric: She has a normal mood and affect. Her behavior is normal.   Nursing note and vitals reviewed.       MDM       Procedures          Headache is much better patient will be discharged;   Shadee Montoya P Treyce Spillers, MD  2:09 PM

## 2018-09-05 ENCOUNTER — Encounter

## 2018-09-12 ENCOUNTER — Inpatient Hospital Stay: Payer: PRIVATE HEALTH INSURANCE | Attending: Internal Medicine | Primary: Family Medicine

## 2018-09-22 ENCOUNTER — Inpatient Hospital Stay: Payer: PRIVATE HEALTH INSURANCE | Attending: Internal Medicine | Primary: Family Medicine

## 2019-10-23 ENCOUNTER — Encounter

## 2019-10-28 ENCOUNTER — Ambulatory Visit: Payer: PRIVATE HEALTH INSURANCE | Primary: Family Medicine

## 2019-10-29 ENCOUNTER — Ambulatory Visit: Payer: PRIVATE HEALTH INSURANCE | Primary: Family Medicine

## 2019-11-12 ENCOUNTER — Ambulatory Visit: Payer: PRIVATE HEALTH INSURANCE | Primary: Family Medicine

## 2021-03-10 NOTE — Progress Notes (Signed)
**Note Valerie-Identified via Obfuscation** Sleep Medicine   Office Visit  Patient Name: Valerie HollingsheadDeborah H Conway DOB: 08/27/1956 MRN [id number]    Chief Complaint: poor sleep quality  Brief History:  Valerie PoundDeborah is here today for initial consult for sleep evaluation.    Sleep quality is poor. This is noted most nights. Patient reports she has insomnia. The patient relates the following symptoms: snoring, gasping,and choking are also present. The patient goes to sleep at 12-2am and wakes up at 8-10am. Patient reports she takes medication to help her sleep. Patient states she will wake up 2 to 3 times a night without taking her sleep medication. Patient reports morning headaches occasionally. Patient takes 2 naps a week for 1-2 hours. Sleep quality is worse when outside home environment.  Patient has noted she has restless legs at times. The patient  relates no unusual behavior . The patient does have a history of psychiatric problems. Patient reports she has memory issues and finds it hard to focus and concentrate through during the day.  Patient reports she has nightmares. The Epworth Sleepiness Score is 12 out of 24 . The patient relates  Cardiovascular risk factors include: hx of elevated BP without diagnosis of HTN. The patient reports a lot of life stress and that she lost her husband.   ROS  General: (-) fever, (-) chills, (-) night sweat Nose and Sinuses: (-) nasal stuffiness or itchiness, (-) postnasal drip, (-) nosebleeds, (-) sinus trouble. Mouth and Throat: (-) sore throat, (-) hoarseness. Neck: (-) swollen glands, (-) enlarged thyroid, (-) neck pain. Respiratory: - cough, - shortness of breath, - wheezing. Neurologic: + numbness, + tingling. Psychiatric: + anxiety, + depression Sleep behavior: -sleep paralysis -hypnogogic hallucinations -dream enactment      -vivid dreams -cataplexy -night terrors -sleep walking   Current Medication: Outpatient Encounter Medications as of 03/14/2021  Medication Sig Note   busPIRone (BUSPAR) 5 MG  tablet buspirone 5 mg tablet  TAKE 1 TABLET BY MOUTH TWICE A DAY    cyclobenzaprine (FLEXERIL) 5 MG tablet Take by mouth.    mesalamine (ROWASA) 4 g enema Place rectally.    sulfamethoxazole-trimethoprim (BACTRIM DS) 800-160 MG tablet Take by mouth.    acetaminophen (TYLENOL) 500 MG tablet Take 1,000 mg by mouth daily as needed for mild pain, moderate pain or headache.    acetaminophen (TYLENOL) 500 MG tablet Take by mouth.    bismuth subsalicylate (PEPTO BISMOL) 262 MG chewable tablet Chew 524 mg by mouth 2 (two) times daily as needed for indigestion.     cholecalciferol (VITAMIN D) 1000 UNITS tablet Take 3,000 Units by mouth 2 (two) times daily.     dicyclomine (BENTYL) 20 MG tablet Take 10 mg by mouth daily.  04/06/2015: Scheduled    FLAGYL 500 MG tablet Take 1/2 tablets by mouth three times a day    FLUoxetine (PROZAC) 20 MG tablet Take 20 mg by mouth daily.    hyoscyamine (LEVSIN SL) 0.125 MG SL tablet Place 1 tablet (0.125 mg total) under the tongue every 4 (four) hours as needed.    LORazepam (ATIVAN) 1 MG tablet Take 1 mg by mouth 2 (two) times daily at 10 AM and 5 PM.     NEXIUM 40 MG capsule Take 1 capsule (40 mg total) by mouth 2 (two) times daily.    OVER THE COUNTER MEDICATION Take 1 scoop by mouth daily. 04/06/2015: OTC magnesium powder containing other vitamins. Pt reports it helps with her stomach problems   Probiotic Product (ALIGN) 4 MG CAPS  Take 4 mg by mouth 3 (three) times daily.     promethazine (PHENERGAN) 25 MG tablet Take 1 tablet (25 mg total) by mouth every 6 (six) hours as needed for nausea or vomiting.    ROBINUL-FORTE 2 MG tablet Take 1 tablet (2 mg total) by mouth 2 (two) times daily.    sulfamethoxazole-trimethoprim (BACTRIM DS,SEPTRA DS) 800-160 MG per tablet Take 1 tablet by mouth 2 (two) times daily.    zolpidem (AMBIEN) 10 MG tablet Take 10 mg by mouth.    [DISCONTINUED] ciprofloxacin (CIPRO) 500 MG tablet Take 1 tablet (500 mg total) by mouth 2 (two) times  daily.    [DISCONTINUED] traMADol (ULTRAM) 50 MG tablet Take 1 tablet (50 mg total) by mouth as needed. Pt not used yet    No facility-administered encounter medications on file as of 03/14/2021.    Surgical History: Past Surgical History:  Procedure Laterality Date   CHOLECYSTECTOMY     MANDIBLE SURGERY      Medical History: Past Medical History:  Diagnosis Date   Anxiety and depression    Cervical disc disease    Diverticulosis    Fibromyalgia    Hepatic artery stenosis (HCC)    IBS (irritable bowel syndrome)    Ileus (HCC)    Insomnia    Post-cholecystectomy syndrome    Recurrent vaginitis     Family History: Non contributory to the present illness  Social History: Social History   Socioeconomic History   Marital status: Married    Spouse name: Not on file   Number of children: Not on file   Years of education: Not on file   Highest education level: Not on file  Occupational History   Occupation: Homemaker  Tobacco Use   Smoking status: Never   Smokeless tobacco: Never  Substance and Sexual Activity   Alcohol use: Yes    Alcohol/week: 0.0 standard drinks    Comment: Social   Drug use: No   Sexual activity: Not Currently  Other Topics Concern   Not on file  Social History Narrative   Not on file   Social Determinants of Health   Financial Resource Strain: Not on file  Food Insecurity: Not on file  Transportation Needs: Not on file  Physical Activity: Not on file  Stress: Not on file  Social Connections: Not on file  Intimate Partner Violence: Not on file    Vital Signs: Blood pressure 121/74, pulse 79, temperature (!) 96.4 F (35.8 C), resp. rate 18, height 5\' 4"  (1.626 m), weight 167 lb (75.8 kg), SpO2 96 %.  Examination: General Appearance: The patient is well-developed, well-nourished, and in no distress. Neck Circumference: 36 cm Skin: Gross inspection of skin unremarkable. Head: normocephalic, no gross deformities. Eyes: no gross  deformities noted. ENT: ears appear grossly normal Neurologic: Alert and oriented. No involuntary movements.    EPWORTH SLEEPINESS SCALE:  Scale:  (0)= no chance of dozing; (1)= slight chance of dozing; (2)= moderate chance of dozing; (3)= high chance of dozing  Chance  Situtation    Sitting and reading: 3    Watching TV: 3    Sitting Inactive in public: 0    As a passenger in car: 2      Lying down to rest: 3    Sitting and talking: 0    Sitting quielty after lunch: 1    In a car, stopped in traffic: 0   TOTAL SCORE:   12 out of 24    SLEEP  STUDIES:  none   LABS: No results found for this or any previous visit (from the past 2160 hour(s)).  Radiology: CT Abdomen Pelvis W Contrast  Result Date: 04/06/2015 CLINICAL DATA:  LEFT lower quadrant pain with nausea, leukocytosis, history diverticulitis EXAM: CT ABDOMEN AND PELVIS WITH CONTRAST TECHNIQUE: Multidetector CT imaging of the abdomen and pelvis was performed using the standard protocol following bolus administration of intravenous contrast. Sagittal and coronal MPR images reconstructed from axial data set. CONTRAST:  OMNIPAQUE IOHEXOL 300 MG/ML SOLN IV. Dilute oral contrast. COMPARISON:  03/28/2015 FINDINGS: Lung bases clear. Gallbladder surgically absent. Minimal fatty infiltration of liver. Small LEFT renal cysts. Liver, spleen, pancreas, kidneys, and adrenal glands otherwise normal appearance. Normal appendix. Distal colonic diverticulosis with minimal sigmoid wall thickening. Pericolic inflammatory changes seen previously have resolved. Stomach and remaining bowel loops normal appearance. Decompressed bladder, distal ureters, uterus and adnexa unremarkable. No mass, adenopathy, free fluid, or free air. Tiny umbilical hernia containing fat. Osseous structures unremarkable. IMPRESSION: Distal colonic diverticulosis with mild wall thickening at the sigmoid colon similar distribution to that seen on the previous  exam likely representing residual changes from acute diverticulitis. Resolution of pericolic inflammatory changes seen on previous exam. Minimal fatty infiltration of liver. Small LEFT renal cysts. Tiny umbilical hernia containing fat. Electronically Signed   By: Ulyses Southward M.D.   On: 04/06/2015 18:45    No results found.  No results found.    Assessment and Plan: Patient Active Problem List   Diagnosis Date Noted   Hypokalemia    Adjustment disorder with mixed anxiety and depressed mood    Abdominal pain, LLQ    Diverticulitis 04/06/2015   Generalized anxiety disorder 10/11/2011   GERD (gastroesophageal reflux disease) 10/11/2011   ANXIETY 05/12/2009   MDD (major depressive disorder), recurrent episode, moderate (HCC) 05/12/2009   HYPERTENSION 05/12/2009   INTERNAL HEMORRHOIDS 05/12/2009   DIVERTICULOSIS OF COLON 05/12/2009   IRRITABLE BOWEL SYNDROME 05/12/2009   FIBROMYALGIA 05/12/2009   INSOMNIA 05/12/2009   ABDOMINAL PAIN RIGHT UPPER QUADRANT 05/12/2009   COLITIS, HX OF 05/12/2009     PLAN OSA:   Patient evaluation suggests high risk of sleep disordered breathing due to snoring, gasping, morning headaches, and difficulty concentrating. Patient has comorbid cardiovascular risk factors including: hx of borderline elevated BP which could be exacerbated by pathologic sleep-disordered breathing.  Suggest: PSG to assess/treat the patient's sleep disordered breathing.   1. Hypersomnia Will order PSG  2. Generalized anxiety disorder Continue current medication and f/u with PCP.  3. MDD (major depressive disorder), recurrent episode, moderate (HCC) Continue current medication and f/u with PCP.  4. Insomnia, unspecified type Currently trying to taper off some medications and trying lower dose of ambien. Follow up with PCP.   General Counseling: I have discussed the findings of the evaluation and examination with Valerie Conway.  I have also discussed any further diagnostic  evaluation thatmay be needed or ordered today. Valerie Conway verbalizes understanding of the findings of todays visit. We also reviewed her medications today and discussed drug interactions and side effects including but not limited excessive drowsiness and altered mental states. We also discussed that there is always a risk not just to her but also people around her. she has been encouraged to call the office with any questions or concerns that should arise related to todays visit.  No orders of the defined types were placed in this encounter.       I have personally obtained a history, evaluated the patient,  evaluated pertinent data, formulated the assessment and plan and placed orders.  This patient was seen by Lynn Ito, PA-C in collaboration with Dr. Freda Munro as a part of collaborative care agreement.   Valentino Hue Sol Blazing, PhD, FAASM  Diplomate, American Board of Sleep Medicine    Yevonne Pax, MD [facility] Diplomate ABMS Pulmonary and Critical Care Medicine Sleep medicine

## 2021-03-14 ENCOUNTER — Ambulatory Visit (INDEPENDENT_AMBULATORY_CARE_PROVIDER_SITE_OTHER): Payer: Medicare Other | Admitting: Internal Medicine

## 2021-03-14 DIAGNOSIS — G4710 Hypersomnia, unspecified: Secondary | ICD-10-CM

## 2021-03-14 DIAGNOSIS — F331 Major depressive disorder, recurrent, moderate: Secondary | ICD-10-CM

## 2021-03-14 DIAGNOSIS — G4700 Insomnia, unspecified: Secondary | ICD-10-CM

## 2021-03-14 DIAGNOSIS — F411 Generalized anxiety disorder: Secondary | ICD-10-CM | POA: Diagnosis not present

## 2021-03-14 NOTE — Patient Instructions (Signed)
Insomnia Insomnia is a sleep disorder that makes it difficult to fall asleep or stay asleep. Insomnia can cause fatigue, low energy, difficulty concentrating, moodswings, and poor performance at work or school. There are three different ways to classify insomnia: Difficulty falling asleep. Difficulty staying asleep. Waking up too early in the morning. Any type of insomnia can be long-term (chronic) or short-term (acute). Both are common. Short-term insomnia usually lasts for three months or less. Chronic insomnia occurs at least three times a week for longer than threemonths. What are the causes? Insomnia may be caused by another condition, situation, or substance, such as: Anxiety. Certain medicines. Gastroesophageal reflux disease (GERD) or other gastrointestinal conditions. Asthma or other breathing conditions. Restless legs syndrome, sleep apnea, or other sleep disorders. Chronic pain. Menopause. Stroke. Abuse of alcohol, tobacco, or illegal drugs. Mental health conditions, such as depression. Caffeine. Neurological disorders, such as Alzheimer's disease. An overactive thyroid (hyperthyroidism). Sometimes, the cause of insomnia may not be known. What increases the risk? Risk factors for insomnia include: Gender. Women are affected more often than men. Age. Insomnia is more common as you get older. Stress. Lack of exercise. Irregular work schedule or working night shifts. Traveling between different time zones. Certain medical and mental health conditions. What are the signs or symptoms? If you have insomnia, the main symptom is having trouble falling asleep or having trouble staying asleep. This may lead to other symptoms, such as: Feeling fatigued or having low energy. Feeling nervous about going to sleep. Not feeling rested in the morning. Having trouble concentrating. Feeling irritable, anxious, or depressed. How is this diagnosed? This condition may be diagnosed based  on: Your symptoms and medical history. Your health care provider may ask about: Your sleep habits. Any medical conditions you have. Your mental health. A physical exam. How is this treated? Treatment for insomnia depends on the cause. Treatment may focus on treating an underlying condition that is causing insomnia. Treatment may also include: Medicines to help you sleep. Counseling or therapy. Lifestyle adjustments to help you sleep better. Follow these instructions at home: Eating and drinking  Limit or avoid alcohol, caffeinated beverages, and cigarettes, especially close to bedtime. These can disrupt your sleep. Do not eat a large meal or eat spicy foods right before bedtime. This can lead to digestive discomfort that can make it hard for you to sleep.  Sleep habits  Keep a sleep diary to help you and your health care provider figure out what could be causing your insomnia. Write down: When you sleep. When you wake up during the night. How well you sleep. How rested you feel the next day. Any side effects of medicines you are taking. What you eat and drink. Make your bedroom a dark, comfortable place where it is easy to fall asleep. Put up shades or blackout curtains to block light from outside. Use a white noise machine to block noise. Keep the temperature cool. Limit screen use before bedtime. This includes: Watching TV. Using your smartphone, tablet, or computer. Stick to a routine that includes going to bed and waking up at the same times every day and night. This can help you fall asleep faster. Consider making a quiet activity, such as reading, part of your nighttime routine. Try to avoid taking naps during the day so that you sleep better at night. Get out of bed if you are still awake after 15 minutes of trying to sleep. Keep the lights down, but try reading or doing a quiet   activity. When you feel sleepy, go back to bed.  General instructions Take over-the-counter  and prescription medicines only as told by your health care provider. Exercise regularly, as told by your health care provider. Avoid exercise starting several hours before bedtime. Use relaxation techniques to manage stress. Ask your health care provider to suggest some techniques that may work well for you. These may include: Breathing exercises. Routines to release muscle tension. Visualizing peaceful scenes. Make sure that you drive carefully. Avoid driving if you feel very sleepy. Keep all follow-up visits as told by your health care provider. This is important. Contact a health care provider if: You are tired throughout the day. You have trouble in your daily routine due to sleepiness. You continue to have sleep problems, or your sleep problems get worse. Get help right away if: You have serious thoughts about hurting yourself or someone else. If you ever feel like you may hurt yourself or others, or have thoughts about taking your own life, get help right away. You can go to your nearest emergency department or call: Your local emergency services (911 in the [place] A suicide crisis helpline, such as the National Suicide Prevention Lifeline at 1-800-273-8255. This is open 24 hours a day. Summary Insomnia is a sleep disorder that makes it difficult to fall asleep or stay asleep. Insomnia can be long-term (chronic) or short-term (acute). Treatment for insomnia depends on the cause. Treatment may focus on treating an underlying condition that is causing insomnia. Keep a sleep diary to help you and your health care provider figure out what could be causing your insomnia. This information is not intended to replace advice given to you by your health care provider. Make sure you discuss any questions you have with your healthcare provider. Document Revised: 07/14/2020 Document Reviewed: 07/14/2020 [employer] Patient Education  2022 [employer]
